# Patient Record
Sex: Female | Born: 1965 | Race: Black or African American | Hispanic: No | Marital: Married | Smoking: Current every day smoker
Health system: Southern US, Community
[De-identification: ages and names within clinical notes are randomized; demographics above are authoritative.]

## PROBLEM LIST (undated history)

## (undated) HISTORY — PX: BUNIONECTOMY WITH HAMMERTOE RECONSTRUCTION: SHX5600

## (undated) HISTORY — PX: TONSILLECTOMY: SUR1361

---

## 2004-10-27 ENCOUNTER — Emergency Department: Payer: Self-pay | Admitting: Emergency Medicine

## 2004-12-09 ENCOUNTER — Emergency Department: Payer: Self-pay | Admitting: Emergency Medicine

## 2005-11-07 ENCOUNTER — Emergency Department: Payer: Self-pay | Admitting: Emergency Medicine

## 2005-12-04 ENCOUNTER — Emergency Department: Payer: Self-pay | Admitting: Emergency Medicine

## 2006-02-18 ENCOUNTER — Other Ambulatory Visit: Payer: Self-pay

## 2006-02-18 ENCOUNTER — Emergency Department: Payer: Self-pay | Admitting: Emergency Medicine

## 2010-12-03 ENCOUNTER — Ambulatory Visit: Payer: Self-pay

## 2011-04-02 ENCOUNTER — Ambulatory Visit: Payer: Self-pay | Admitting: Internal Medicine

## 2011-04-06 ENCOUNTER — Ambulatory Visit: Payer: Self-pay | Admitting: Internal Medicine

## 2013-03-07 ENCOUNTER — Ambulatory Visit: Payer: Self-pay | Admitting: Family Medicine

## 2013-10-25 ENCOUNTER — Emergency Department: Payer: Self-pay

## 2013-10-25 LAB — BASIC METABOLIC PANEL
Anion Gap: 8 (ref 7–16)
BUN: 11 mg/dL (ref 7–18)
Calcium, Total: 8.9 mg/dL (ref 8.5–10.1)
Chloride: 106 mmol/L (ref 98–107)
Co2: 25 mmol/L (ref 21–32)
Creatinine: 0.68 mg/dL (ref 0.60–1.30)
EGFR (Non-African Amer.): >60
Osmolality: 277 (ref 275–301)
Potassium: 3.4 mmol/L — ABNORMAL LOW (ref 3.5–5.1)
Sodium: 139 mmol/L (ref 136–145)

## 2013-10-25 LAB — TROPONIN I: Troponin-I: <0.02 ng/mL

## 2013-10-25 LAB — CBC
HCT: 36.3 % (ref 35.0–47.0)
MCH: 28.3 pg (ref 26.0–34.0)
Platelet: 208 10*3/uL (ref 150–440)
RDW: 13.6 % (ref 11.5–14.5)

## 2014-03-08 ENCOUNTER — Ambulatory Visit: Payer: Self-pay | Admitting: Family Medicine

## 2014-08-21 ENCOUNTER — Ambulatory Visit (INDEPENDENT_AMBULATORY_CARE_PROVIDER_SITE_OTHER): Payer: BC Managed Care – PPO | Admitting: Podiatry

## 2014-08-21 ENCOUNTER — Ambulatory Visit (INDEPENDENT_AMBULATORY_CARE_PROVIDER_SITE_OTHER): Payer: BC Managed Care – PPO

## 2014-08-21 ENCOUNTER — Encounter: Payer: Self-pay | Admitting: Podiatry

## 2014-08-21 VITALS — BP 121/74 | HR 64 | Resp 16 | Ht 63.0 in | Wt 160.0 lb

## 2014-08-21 DIAGNOSIS — M21612 Bunion of left foot: Secondary | ICD-10-CM

## 2014-08-21 DIAGNOSIS — M21619 Bunion of unspecified foot: Secondary | ICD-10-CM

## 2014-08-21 DIAGNOSIS — M2040 Other hammer toe(s) (acquired), unspecified foot: Secondary | ICD-10-CM

## 2014-08-21 NOTE — Progress Notes (Signed)
Subjective:     Patient ID: Brandi Cruz, female   DOB: 01-29-1966, 48 y.o.   MRN: [id number]  Foot Pain   patient presents stating I'm having so much problems with my left foot and that when I try to wear shoes it hurts and I have tried to change my shoe gear soak my feet and trimming the callus on the bottom without relief. states it's been going on around 2 years   Review of Systems  All other systems reviewed and are negative.      Objective:   Physical Exam  Nursing note and vitals reviewed. Constitutional: She is oriented to person, place, and time.  Cardiovascular: Intact distal pulses.   Musculoskeletal: Normal range of motion.  Neurological: She is oriented to person, place, and time.  Skin: Skin is warm.   neurovascular status found to be intact with muscle strength adequate and range of motion of the subtalar and midtarsal joint within normal limits. Patient is noted to have well-perfused toes and is noted to have significant contracture of the lesser digits left over right with severe keratotic lesion sub-third metatarsal left over right. Large structural bunion on the left foot is noted that red and painful when pressed     Assessment:     Significant structural malalignment of the left foot with structural bunion digital deformity of the second third fourth and fifth toes and plantarflexed third metatarsal with keratotic lesion left over right    Plan:     H&P and x-rays reviewed with patient. I have recommended structural bunion correction along with digital fusion of digits 234 elevating osteotomy third and hammertoe repair fifth left. I did explain there is no guarantee that this will make the lesion go away but we'll give [place] her best chance to get better for the long-term. Today I did debris lesions to reduce pressure and reappoint in the next several weeks to review and is tentatively scheduled for surgery and October

## 2014-08-21 NOTE — Progress Notes (Signed)
   Subjective:    Patient ID: Brandi Cruz, female    DOB: 1966/10/27, 48 y.o.   MRN: [id number]  HPI Comments: My left foot has a bunion and my foot is turning over. Ive had it for over 2 years. Its getting worse. Shoes will hurt my foot. When i work over 8 hrs my foot will hurt while standing and walking. i buy otc stuff for my foot. They were all dr scholls products.  Foot Pain      Review of Systems  Genitourinary: Positive for frequency.  All other systems reviewed and are negative.      Objective:   Physical Exam        Assessment & Plan:

## 2014-09-04 ENCOUNTER — Ambulatory Visit (INDEPENDENT_AMBULATORY_CARE_PROVIDER_SITE_OTHER): Payer: BC Managed Care – PPO | Admitting: Podiatry

## 2014-09-04 VITALS — BP 127/78 | HR 58 | Resp 16

## 2014-09-04 DIAGNOSIS — M21612 Bunion of left foot: Secondary | ICD-10-CM

## 2014-09-04 DIAGNOSIS — M2012 Hallux valgus (acquired), left foot: Secondary | ICD-10-CM

## 2014-09-04 DIAGNOSIS — M2042 Other hammer toe(s) (acquired), left foot: Secondary | ICD-10-CM

## 2014-09-04 DIAGNOSIS — D649 Anemia, unspecified: Secondary | ICD-10-CM

## 2014-09-04 DIAGNOSIS — Q828 Other specified congenital malformations of skin: Secondary | ICD-10-CM

## 2014-09-04 NOTE — Progress Notes (Signed)
Patient ID: Brandi Cruz, female   DOB: 1966-09-18, 48 y.o.   MRN: [id number]  Subjective: Ms. Brandi Cruz returns the office today for complaints of left foot bunion and painful hammertoes. She states his been ongoing for several years but has been increasing painful the last year. She has tried shoe gear changes to help elevate pressure off of her feet without any resolution of symptoms. She is also tried offloading pads. At this time the patient is requesting surgical intervention to help decrease her pain and deformity. Discussed that as she gets periodic cramping in her legs. No acute changes since last appointment.  Objective: AAO x3, NAD DP/PT pulses palpable bilaterally, CRT less than 3 seconds Protective sensation intact with Simms Weinstein monofilament, vibratory sensation intact, Achilles reflex intact. Moderate structural HAV deformity, without any hypermobility. Tenderness to palpation over the prominent dorsal medial eminence of the first metatarsal head over the bunion deformity. Overlying skin intact. Semirigid hammertoe deformities of digits 2 through 5 with tenderness over the dorsal PIPJ. Diffuse hyperkeratotic lesion submetatarsal 2 and 3. MMT 5/5, ROM WNL.  No calf pain, swelling, warmth, erythema. No open lesions  Assessment: 48 year old female with left foot moderate HAV deformity, hammertoe deformities 2 through 5.  Plan: -X-rays were reviewed with the patient. -She follow up with me today due to her scheduled. I offered to have her see Dr. Paulla Dolly since he initially saw the patient. She is unable to digits her schedule. -Discussed with the patient likely surgical intervention would be offset-V bunionectomy with screw fixation, hammertoe repair digits 2 through 4 involving PIPJ arthrodesis with K wire fixation, fifth digit arthroplasty, second and third metatarsal osteotomies with screw fixation. Discussed with the patient the postoperative course in detail. Also discussed with her  alternatives, risks, complications of surgery. Risks of the surgery which were discussed with her in layman terms include, but not limited to, infection, bleeding, pain, swelling, need for further surgery, delayed or nonhealing, painful or ugly scar, numbness or sensation changes, blood clots, reoccurrence, transfer lesions, hardware failure, and blood clots. Patient understands the risks of this. She wishes to proceed with surgery. Surgical consent was reviewed the patient for which she signed. -Prior to surgery will obtain noninvasive vascular testing due to cramping in her legs. She did quit smoking approximately 2 weeks ago. Prior to this she was smoking one pack per week. -Also will obtain blood work as she states she has "low blood".  -Dispensed CAM boot for after surgery. -Will followup after surgery. In the meantime call the office with any questions, concerns, change in symptoms.

## 2014-09-10 ENCOUNTER — Telehealth: Payer: Self-pay | Admitting: *Deleted

## 2014-09-10 NOTE — Telephone Encounter (Signed)
Called and left message letting pt know appt at [facility] 10.23.15 11:30.

## 2014-10-12 ENCOUNTER — Telehealth: Payer: Self-pay | Admitting: *Deleted

## 2014-10-12 NOTE — Telephone Encounter (Signed)
Pt called wanting to know where she needs to be on the 23rd 24th or 25th? Called and left message for pt to return my call so we can figure out what she is talking about.

## 2014-10-16 ENCOUNTER — Ambulatory Visit: Payer: Self-pay | Admitting: Podiatry

## 2014-10-16 LAB — CBC WITH DIFFERENTIAL/PLATELET
Basophil #: 0 10*3/uL (ref 0.0–0.1)
Basophil %: 0.6 %
Eosinophil #: 0.2 10*3/uL (ref 0.0–0.7)
Eosinophil %: 2.8 %
HCT: 39.6 % (ref 35.0–47.0)
HGB: 12.6 g/dL (ref 12.0–16.0)
LYMPHS ABS: 1.6 10*3/uL (ref 1.0–3.6)
Lymphocyte %: 27.2 %
MCH: 27.7 pg (ref 26.0–34.0)
MCHC: 31.9 g/dL — AB (ref 32.0–36.0)
MCV: 87 fL (ref 80–100)
MONO ABS: 0.5 x10 3/mm (ref 0.2–0.9)
MONOS PCT: 9 %
NEUTROS ABS: 3.5 10*3/uL (ref 1.4–6.5)
Neutrophil %: 60.4 %
PLATELETS: 228 10*3/uL (ref 150–440)
RBC: 4.55 10*6/uL (ref 3.80–5.20)
RDW: 13.4 % (ref 11.5–14.5)
WBC: 5.8 10*3/uL (ref 3.6–11.0)

## 2014-10-16 LAB — BASIC METABOLIC PANEL
Anion Gap: 5 — ABNORMAL LOW (ref 7–16)
BUN: 12 mg/dL (ref 7–18)
CALCIUM: 8.5 mg/dL (ref 8.5–10.1)
Chloride: 110 mmol/L — ABNORMAL HIGH (ref 98–107)
Co2: 28 mmol/L (ref 21–32)
Creatinine: 0.74 mg/dL (ref 0.60–1.30)
EGFR (African American): >60
Glucose: 95 mg/dL (ref 65–99)
Osmolality: 285 (ref 275–301)
Potassium: 4.1 mmol/L (ref 3.5–5.1)
Sodium: 143 mmol/L (ref 136–145)

## 2014-10-22 ENCOUNTER — Encounter: Payer: Self-pay | Admitting: Podiatry

## 2014-10-22 DIAGNOSIS — M2012 Hallux valgus (acquired), left foot: Secondary | ICD-10-CM

## 2014-10-22 DIAGNOSIS — M21542 Acquired clubfoot, left foot: Secondary | ICD-10-CM

## 2014-10-22 DIAGNOSIS — M2042 Other hammer toe(s) (acquired), left foot: Secondary | ICD-10-CM

## 2014-10-23 ENCOUNTER — Encounter: Payer: Self-pay | Admitting: Podiatry

## 2014-10-23 NOTE — Progress Notes (Signed)
Reviewed

## 2014-10-26 NOTE — Progress Notes (Signed)
Austin bunionectomy left Metatarsal osteotomy left Hammer toe repair 4th left  Dos 11.30.15

## 2014-10-30 ENCOUNTER — Ambulatory Visit (INDEPENDENT_AMBULATORY_CARE_PROVIDER_SITE_OTHER): Payer: BC Managed Care – PPO | Admitting: Podiatry

## 2014-10-30 ENCOUNTER — Ambulatory Visit (INDEPENDENT_AMBULATORY_CARE_PROVIDER_SITE_OTHER): Payer: BC Managed Care – PPO

## 2014-10-30 VITALS — BP 117/71 | HR 69 | Resp 16

## 2014-10-30 DIAGNOSIS — Z9889 Other specified postprocedural states: Secondary | ICD-10-CM

## 2014-10-30 DIAGNOSIS — M2012 Hallux valgus (acquired), left foot: Secondary | ICD-10-CM

## 2014-10-30 DIAGNOSIS — M21612 Bunion of left foot: Secondary | ICD-10-CM

## 2014-10-30 MED ORDER — OXYCODONE-ACETAMINOPHEN 5-325 MG PO TABS: 1.0000 | ORAL_TABLET | Freq: Four times a day (QID) | ORAL | Status: AC | PRN

## 2014-10-30 NOTE — Progress Notes (Signed)
Patient ID: Brandi Cruz, female   DOB: [birth year], 48 y.o.   MRN: [id number]  Subjective: 48 year old female returns the office today 7 days status post left foot Austin bunionectomy with K-wire fixation, hammertoe repair digits 2 through 5 metatarsal osteotomies 2-4 (DOS: 10/23/14). She states that she has remained nonweightbearing as much as possible but she has been putting some weight to the foot. She has been wearing the cam walker. She states that she has no significant pain at this time and her pain is adequately controlled with pain medication. She denies any systemic complaints as fevers, chills, nausea, vomiting. She has finished her course of antibiotics. No other complaints at this time.  Objective: AAO x3, NAD DP/PT pulses palpable bilaterally, CRT less than 3 seconds Protective sensation intact with Simms Weinstein monofilament, vibratory sensation intact, Achilles tendon reflex intact. Incisions along the dorsal medial aspect the left foot and along digits 2 through 5 are well coapted without any evidence of dehiscence and sutures are intact. K-wires intact to digits 2 through 4. There is no surrounding erythema, drainage, increase in warmth to the area; no clinical signs of infection.There is edema to the midfoot. Motor function is intact to the digits and ankle. There is no significant pain on palpation to the area. No pain with calf compression, swelling, warmth, edema.   Assessment : 48 year old female 1 week status post Austin bunionectomy with hammertoe repair digits 2 through 5 metatarsal osteotomies 2 through 4.   Plan : -X-rays were obtained and reviewed with the patient. There is a previous slight contracture of the digit with wire fixation intact however clinically the toes appear to be in rectus alignment. Discussed the patient that if needed the K wire may need to come out early in the toe splinted. Patient stands. We'll continue to monitor the area.  -Betadine was painted  over all the incisions followed by dry sterile dressing. -Antibiotic ointment to the distal acid of the digits around the K wires. -Refilled Percocet.  -Continue wearing a CAM walker and to limit weightbearing. Discussed with patient and she should put the weight on the heel and not the forefoot. Continue with crutches. -Continue monitor signs or symptoms of DVT/PE and directed to the emergency room should any occur.  -Monitor for signs or symptoms of infection and directed to call the office if any are to occur or go to the emergency room.  -Follow-up next Thursday for likely suture removal. In the meantime, call the office if any questions, concerns, change in symptoms.

## 2014-11-08 ENCOUNTER — Ambulatory Visit (INDEPENDENT_AMBULATORY_CARE_PROVIDER_SITE_OTHER): Payer: BC Managed Care – PPO | Admitting: Podiatry

## 2014-11-08 VITALS — BP 131/88 | HR 109 | Resp 16

## 2014-11-08 DIAGNOSIS — Z9889 Other specified postprocedural states: Secondary | ICD-10-CM

## 2014-11-08 NOTE — Progress Notes (Signed)
Patient ID: Brandi Cruz, female   DOB: 05-31-1966, 48 y.o.   MRN: [id number]  Subjective: 48 year old female returns the office today 2 weeks  status post left foot Austin bunionectomy with K-wire fixation, hammertoe repair digits 2 through 5 metatarsal osteotomies 2-4 (DOS: 10/23/14). She states that she has been starting to put some weight on the foot in the CAM boot but continues to use crutches.  She states that she has no significant pain at this time and her pain is adequately controlled with pain medication. She denies any systemic complaints as fevers, chills, nausea, vomiting. She has finished her course of antibiotics. Denies any chest pain, SOB, calf pain. No other complaints at this time.  Objective: AAO x3, NAD DP/PT pulses palpable bilaterally, CRT less than 3 seconds Protective sensation intact with Simms Weinstein monofilament, vibratory sensation intact, Achilles tendon reflex intact. Incisions along the dorsal medial aspect the left foot and along digits 2 through 5 are well coapted without any evidence of dehiscence and sutures are intact. K-wires intact to digits 2 through 4. There is no surrounding erythema, drainage, increase in warmth to the area; no clinical signs of infection.There is edema to the foot over the surgical site, however decreased. Mild discomfort directly over the surgical site. 1st MTPJ ROM without pain. Motor function is intact to the digits and ankle. No pain with calf compression, swelling, warmth, edema.   Assessment : 48 year old female 2 weeks status post Austin bunionectomy with hammertoe repair digits 2 through 5 metatarsal osteotomies 2 through 4.   Plan : -Treatment options discussed including all alternatives, risks, complications.  -Sutures were removed without complications. Antibiotic ointment was applied over the incision followed by a dressing. Discussed with the patient to keep the dressing clean, dry, intact. Antibiotic ointment was also placed  around the distal aspect around the K wires. -Continue wearing a CAM walker and can weight-bear as tolerated but to do this gradually.  -Discussed range of motion activities for the first MTPJ to help prevent stiffness. -Continue monitor signs or symptoms of DVT/PE and directed to the emergency room should any occur.  -Monitor for signs or symptoms of infection and directed to call the office if any are to occur or go to the emergency room.  -Follow-up in 2 weeks or sooner if any problems are to arise. In the meantime, call the office if any questions, concerns, change in symptoms. X-rays next  appointment.

## 2014-11-27 ENCOUNTER — Ambulatory Visit (INDEPENDENT_AMBULATORY_CARE_PROVIDER_SITE_OTHER): Payer: BLUE CROSS/BLUE SHIELD

## 2014-11-27 ENCOUNTER — Ambulatory Visit (INDEPENDENT_AMBULATORY_CARE_PROVIDER_SITE_OTHER): Payer: BLUE CROSS/BLUE SHIELD | Admitting: Podiatry

## 2014-11-27 ENCOUNTER — Telehealth: Payer: Self-pay | Admitting: *Deleted

## 2014-11-27 VITALS — BP 137/83 | HR 69 | Resp 16

## 2014-11-27 DIAGNOSIS — M21612 Bunion of left foot: Secondary | ICD-10-CM

## 2014-11-27 DIAGNOSIS — M2012 Hallux valgus (acquired), left foot: Secondary | ICD-10-CM

## 2014-11-27 DIAGNOSIS — Z9889 Other specified postprocedural states: Secondary | ICD-10-CM

## 2014-11-27 DIAGNOSIS — M2042 Other hammer toe(s) (acquired), left foot: Secondary | ICD-10-CM

## 2014-11-27 NOTE — Telephone Encounter (Signed)
Pt states she will be late for her 1000am appt.

## 2014-11-27 NOTE — Progress Notes (Signed)
Patient ID: Brandi Cruz, female   DOB: Oct 15, 1966, 49 y.o.   MRN: [id number]  Subjective: 49 year old female returns the office for follow up evaluation status post left foot Austin bunionectomy, hammertoe repair digits 2-5 metatarsal osteotomies 2-4, date of surgery 10/23/2014. She states that she is continuing the CAM boot and she has been the majority of the weight onto her heel. She states that her pain is controlled and is not requiring narcotic pain medication. She has been continuing with gentle range of motion activities of the first MTPJ. She denies any systemic complaints such as fevers, chills, nausea, vomiting. Denies any calf pain, shortness of breath, chest pain. No other complaints at this time. No acute changes since last appointment.  Objective: AAO 3, NAD DP/PT pulse palpable, CRT less than 3 seconds Protective sensation intact with Simms Weinstein monofilament, Achilles tendon reflex intact. Incisions are well coapted without any evidence of dehiscence. There is no ascending erythema, ascending cellulitis, increased warmth, or any other clinical signs of infection. There is significant decrease in edema compared to prior appointment with skin wrinkles evident. K-wires are intact to the second, third, fourth digits. First MTPJ range of motion is intact and without discomfort. No pain with calf compression, swelling, warmth, erythema No other open lesions or pre-ulcerative lesions are identified.  Assessment: 49 year old female 5 weeks status post Austin bunionectomy, hammertoe repair digits 2-5, metatarsal osteotomies 2-4, doing well  Plan: -X-rays were obtained and reviewed with the patient. -Treatment options were discussed the patient including alternatives, risks, complications -At today's appointment the foot was redressed. Applied a small amount of interbody ointment overlying the incisions into the ends of the toes at the K wire sites. Recommended the patient to  continue doing this as well. Dispensed an ACE bandage to help control swelling. -Can weight-bear as tolerated in a surgical shoe. A surgical shoe was dispensed the patient today. Discussed with her that if she has any increase in pain while wearing this shoe to return to the boot. -Continue ice and elevation -Continue to monitor for any clinical signs or symptoms of infection and/or DVT/PE or any other complications. Stressed to call the office immediately should any occur or go directly to the emergency room. -Follow-up in 10 days for likely K-wire removal and repeat x-rays. In the meantime, encouraged to call the office with any questions, concerns, change in symptoms.

## 2014-12-06 ENCOUNTER — Ambulatory Visit (INDEPENDENT_AMBULATORY_CARE_PROVIDER_SITE_OTHER): Payer: BLUE CROSS/BLUE SHIELD

## 2014-12-06 ENCOUNTER — Ambulatory Visit (INDEPENDENT_AMBULATORY_CARE_PROVIDER_SITE_OTHER): Payer: BLUE CROSS/BLUE SHIELD | Admitting: Podiatry

## 2014-12-06 VITALS — BP 142/84 | HR 72 | Resp 16

## 2014-12-06 DIAGNOSIS — M21612 Bunion of left foot: Secondary | ICD-10-CM

## 2014-12-06 DIAGNOSIS — Z9889 Other specified postprocedural states: Secondary | ICD-10-CM

## 2014-12-06 DIAGNOSIS — M2012 Hallux valgus (acquired), left foot: Secondary | ICD-10-CM

## 2014-12-06 DIAGNOSIS — M2042 Other hammer toe(s) (acquired), left foot: Secondary | ICD-10-CM

## 2014-12-06 NOTE — Progress Notes (Signed)
Patient ID: Brandi Cruz, female   DOB: 12/09/1965, 49 y.o.   MRN: [id number]  Subjective: 49 year old female presents the office today for follow-up evaluation status post left foot Austin bunionectomy, hammertoe repair digits 2 through 5 the metatarsal osteotomies 2 through 4 performed on 10/23/2014. She states that she has been continuing with surgical shoe. She denies any significant pain and she has not required any pain medication. Denies any systemic complaints as fevers, chills, nausea, vomiting. No calf pain, short of breath, chest pain. No other complaints at this time in no acute changes since last appointment.  Objective: AAO 3, NAD DP/PT pulses palpable, CRT less than 3 seconds Protective sensation intact with Simms was monofilament, Achilles tendon reflex intact. Incision along the dorsal medial aspect of the left foot from the prior bunionectomy as well as over the digits are healed at this time. There is no surrounding erythema, increase in warmth, drainage, or any other clinical signs of infection at this time. K wires are intact to the second, third, fourth digit. There is edema overlying the forefoot. There is no increase in warmth or erythema to the foot. Mild tenderness directly overlying the surgical sites however no other areas of tenderness. No other open lesions or pre-ulcer lesions identified. No pain with calf compression, swelling, warmth, erythema.  Assessment: 49 year old female status post left Austin bunionectomy, hammertoe repair digits 2-5, metatarsal osteotomies 2-4; doing well  Plan: -X-rays were obtained and reviewed with the patient. -Treatment options were discussed including alternatives, risks, complications. -At this time as his been 6 weeks a wires were removed from the toes without complications in were removed in total. Antibiotic ointment was applied followed by a bandage. -Discussed the patient how to tape her toes to help control swelling and  to  help splint the toes. -Also at this time she has started transition back into a regular, loosefitting supportive sneaker as tolerated. If she has any significant increase in pain while transition to remain in the surgical shoe and call the office. -She can start to get the area wet in the next day or so however do not soak the foot. Once could now the shower apply anesthetic ointment overlying the incisions. If there is any problems the incisions or along the sites of the K wire removal to hold off on showering and call the office. -Dispensed anklet to help control swelling. -Follow-up in 3 weeks for repeat x-rays or sooner should any problems arise. In the meantime, encouraged to call the office with any questions, concerns, changes symptoms.

## 2014-12-27 ENCOUNTER — Ambulatory Visit (INDEPENDENT_AMBULATORY_CARE_PROVIDER_SITE_OTHER): Payer: BLUE CROSS/BLUE SHIELD | Admitting: Podiatry

## 2014-12-27 ENCOUNTER — Ambulatory Visit (INDEPENDENT_AMBULATORY_CARE_PROVIDER_SITE_OTHER): Payer: BLUE CROSS/BLUE SHIELD

## 2014-12-27 VITALS — BP 120/68 | HR 70

## 2014-12-27 DIAGNOSIS — M21612 Bunion of left foot: Secondary | ICD-10-CM

## 2014-12-27 DIAGNOSIS — R609 Edema, unspecified: Secondary | ICD-10-CM

## 2014-12-27 DIAGNOSIS — M2012 Hallux valgus (acquired), left foot: Secondary | ICD-10-CM

## 2014-12-27 NOTE — Patient Instructions (Signed)
Wear compression wrap to help the swelling Ice/elevation

## 2014-12-28 NOTE — Progress Notes (Signed)
Patient ID: Brandi Cruz, female   DOB: [birth year], 49 y.o.   MRN: [id number]  Subjective: 49 year old female returns the office today status post left Austin bunionectomy, hammertoe repair of digits 2-5, metatarsal ostomies 2-4. She states that overall she is doing well however she does have swelling to her foot which makes it difficult to wear shoes. She states that she has some sharp pain to the second toe otherwise her pain is controlled. She denies any redness over the area or any increase in warmth. She states that she has been on her feet more recently which may be contributing to the swelling. She has not been wearing the compression anklet and she has decreased ice and elevation. Denies any systemic complaints as fevers, chills, nausea, vomiting. No other complaints at this time in no acute changes since last appointment.  Objective: AAO 3, NAD DP/PT pulses palpable, CRT less than 3 seconds Protective sensation intact with Simms 1 monofilament, Achilles tendon reflex intact. Incisions on the surgical site are well healed at this time with eschar. There is no surrounding erythema or increase in warmth. There is moderate edema overlying the dorsal aspect of the forefoot without any overlying erythema or increase in warmth. There is no clinical signs of infection. There is no tenderness to palpation along the surgical sites. There no pain with first MTPJ range of motion however the motion is limited likely is result of the swelling. No other areas of tenderness to bilateral lower extremities. No pain with calf compression, swelling, warmth, erythema.  Assessment: 49 year old female status post left Austin bunionectomy, hammertoe repair of digits 2-5, metatarsal ostomies 2-4; presents with swelling without clinical signs of infection  Plan: -X-rays were obtained and reviewed with the patient. -Treatment options were discussed the patient including alternatives, risks, complications. -At  this time recommended her to continue with the compression anklet. She has not been wearing this. -Continue with ice and elevation as she has discontinued doing this. -She can transition into a regular shoe as tolerated once the swelling resolves. -Follow-up in 3 weeks or sooner if any problems are to arise. In the meantime, encouraged to call the office with any questions, concerns, change in symptoms.

## 2015-01-24 ENCOUNTER — Ambulatory Visit (INDEPENDENT_AMBULATORY_CARE_PROVIDER_SITE_OTHER): Payer: BLUE CROSS/BLUE SHIELD | Admitting: Podiatry

## 2015-01-24 ENCOUNTER — Encounter: Payer: Self-pay | Admitting: Podiatry

## 2015-01-24 ENCOUNTER — Ambulatory Visit (INDEPENDENT_AMBULATORY_CARE_PROVIDER_SITE_OTHER): Payer: BLUE CROSS/BLUE SHIELD

## 2015-01-24 VITALS — BP 137/82 | HR 66 | Resp 16

## 2015-01-24 DIAGNOSIS — M2012 Hallux valgus (acquired), left foot: Secondary | ICD-10-CM | POA: Diagnosis not present

## 2015-01-24 DIAGNOSIS — M21612 Bunion of left foot: Secondary | ICD-10-CM

## 2015-01-24 DIAGNOSIS — Z9889 Other specified postprocedural states: Secondary | ICD-10-CM

## 2015-01-24 NOTE — Progress Notes (Signed)
Patient ID: Brandi Cruz, female   DOB: 07-08-1966, 49 y.o.   MRN: [id number]  Subjective: 49 year old female returns the office today status post left Austin bunionectomy, hammertoe repair of digits 2-5, metatarsal ostomies 2-4 preformed on 10/23/14. She states that overall she is doing well and she has returned back into a regular shoe. She states that she is able to perform daily activities that much discomfort. She states that she gets some intermittent swelling to the foot for which she takes ibuprofen 4. She denies any redness or any increase in warmth to the foot. She has not required any pain medication. Denies any systemic complaints as fevers, chills, nausea, vomiting. No other complaints at this time in no acute changes since last appointment.  Objective: AAO 3, NAD DP/PT pulses palpable, CRT less than 3 seconds Protective sensation intact with Simms weinstein monofilament, Achilles tendon reflex intact. Incisions on the surgical site are well healed at this time. There is no surrounding erythema or increase in warmth. There is edema overlying the dorsal aspect of the forefoot without any overlying erythema or increase in warmth. Edema appears to be improved compared to prior appointment. There is no clinical signs of infection. There is no tenderness to palpation along the surgical sites. There no pain with first MTPJ range of motion . No pain with MTPJ range of motion.  No other areas of tenderness to bilateral lower extremities. No overlying erythema or increase in warmth. No pain with calf compression, swelling, warmth, erythema. Nails on the left foot are hypertrophic, dystrophic, discolored, elongated with tenderness palpation on the nails 1-5 bilaterally. There is no some erythema or drainage. Patient states that she did not want to trim the nails on her surgical foot.  Assessment: 49 year old female status post left Austin bunionectomy, hammertoe repair of digits 2-5, metatarsal  osteotomies 2-4  Plan: -X-rays were obtained and reviewed with the patient. -Treatment options were discussed including alternatives, risks, complications. -At this time continue with regular shoe gear as tolerated. Continue the ankle compression sock to help control swelling. Ice and elevation. -Discussed patient she can return to work on March 14 as tolerated. Discussed with her to try to keep ice and elevation as much as possible. -Discussed orthotics to help support her foot type. A prescription for orthotics was given to the patient for [facility]. -Nail sharply debrided 5 on the left foot without complication/bleeding. -Follow-up in 6 weeks or sooner if any problems are to arise. In the meantime, encouraged to call the office with any questions, concerns, change in symptoms.

## 2015-03-07 ENCOUNTER — Ambulatory Visit: Payer: BLUE CROSS/BLUE SHIELD | Admitting: Podiatry

## 2015-03-12 ENCOUNTER — Ambulatory Visit (INDEPENDENT_AMBULATORY_CARE_PROVIDER_SITE_OTHER): Payer: BLUE CROSS/BLUE SHIELD

## 2015-03-12 ENCOUNTER — Ambulatory Visit (INDEPENDENT_AMBULATORY_CARE_PROVIDER_SITE_OTHER): Payer: BLUE CROSS/BLUE SHIELD | Admitting: Podiatry

## 2015-03-12 VITALS — BP 118/68 | HR 80 | Resp 16

## 2015-03-12 DIAGNOSIS — M2012 Hallux valgus (acquired), left foot: Secondary | ICD-10-CM

## 2015-03-12 DIAGNOSIS — L84 Corns and callosities: Secondary | ICD-10-CM | POA: Diagnosis not present

## 2015-03-12 DIAGNOSIS — B351 Tinea unguium: Secondary | ICD-10-CM

## 2015-03-12 DIAGNOSIS — Z9889 Other specified postprocedural states: Secondary | ICD-10-CM | POA: Diagnosis not present

## 2015-03-13 ENCOUNTER — Ambulatory Visit
Admit: 2015-03-13 | Disposition: A | Discharge status: Home / Self Care | Payer: Self-pay | Attending: Family Medicine | Admitting: Family Medicine

## 2015-03-13 NOTE — Progress Notes (Signed)
Patient ID: Brandi Cruz, female   DOB: 06-24-1966, 49 y.o.   MRN: [id number]  Subjective: 49 year old female presents the office today status post left foot Austin bunionectomy, hammertoe repair of digits 2 through 5, metatarsal osteotomies 2 through 4 performed on 10/23/2014. She states that since last appointment she has been continuing to walk in regular shoe gear and wearing orthotics for which she recently had made. She states that overall she is doing well from the surgical sites. She does that she has calluses in the bottoms of both of her feet which are painful particularly with pressure and weightbearing. She has a states her nails are elongated and painful for which she is unable to trim herself. Denies any redness or drainage on the nail callus sites. Denies any systemic complaints such as fevers, chills, nausea, vomiting. No acute changes since last appointment, and no other complaints at this time.   Objective: AAO x3, NAD DP/PT pulses palpable bilaterally, CRT less than 3 seconds Protective sensation intact with Simms Weinstein monofilament, vibratory sensation intact, Achilles tendon reflex intact Incisions along the dorsal aspect left foot are all well coapted and well formed scars present. There is no surrounding erythema, increase in warmth, or other clinical signs of infection. There is mild edema to the left foot however it is significantly improved compared to prior appointment and is almost about the same as the contralateral extremity. There is no areas of tenderness overlying the surgical sites. There is no pain with first MTPJ range of motion or went to the lesser digit MTPJ's. There is hyperkeratotic lesions bilateral submetatarsal 3. There is also hyperkeratotic lesion on the dorsal lateral aspect of the right fifth toe with underlying adductovarus deformity. Upon debridement of the lesions the underlying skin is intact and there is no swelling erythema, drainage or other clinical  signs of infection. Nails are dystrophic, discolored, elongated, brittle 10. There is just tenderness on nails 1-5 bilaterally. There is no swelling erythema or drainage on the nail sites. No other areas of pinpoint bony tenderness or pain with vibratory sensation. MMT 5/5, ROM WNL. No edema, erythema, increase in warmth to bilateral lower extremities.  No open lesions or pre-ulcerative lesions.  No pain with calf compression, swelling, warmth, erythema  Assessment: 49 year old female status post left foot surgery doing well with submetatarsal 3 hyperkeratotic lesions and symptomatic onychomycosis  Plan: -All treatment options discussed with the patient including all alternatives, risks, complications.  -X-rays were obtained and reviewed the patient -Hyperkeratotic lesion sharply debrided 3 without complication/bleeding pad was placed around lesions to bilateral submetatarsal 3 followed by salicylic acid and occlusive bandage. Post procedure instructions were discussed the patient for which she understood. Monitor for any signs or symptoms of infection. -Nails sharply debrided 10 without complication/bleeding -Metatarsal pads were added to the orthotics -Continue regular shoe gear -Follow-up in 6 weeks -Patient encouraged to call the office with any questions, concerns, change in symptoms.

## 2015-04-03 DIAGNOSIS — F17200 Nicotine dependence, unspecified, uncomplicated: Secondary | ICD-10-CM | POA: Insufficient documentation

## 2015-04-18 ENCOUNTER — Ambulatory Visit (INDEPENDENT_AMBULATORY_CARE_PROVIDER_SITE_OTHER): Payer: BLUE CROSS/BLUE SHIELD | Admitting: Podiatry

## 2015-04-18 DIAGNOSIS — Z9889 Other specified postprocedural states: Secondary | ICD-10-CM | POA: Diagnosis not present

## 2015-04-18 DIAGNOSIS — M2012 Hallux valgus (acquired), left foot: Secondary | ICD-10-CM

## 2015-04-22 NOTE — Progress Notes (Signed)
Patient ID: Brandi Cruz, female   DOB: [birth year], 49 y.o.   MRN: [id number]  Subjective: Brandi Cruz  presents the office today status post left foot Austin bunionectomy, hammertoe repair of digits 2 through 5, metatarsal osteotomies 2 through 4 performed on 10/23/2014. She states that since last appointment she is doing well and she has been continuing to work in a regular shoe without difficulty. She doesn't the area swells intermittently although she does not have any pain to her foot. She denies any redness. Denies any redness or drainage on the nail callus sites. Denies any systemic complaints such as fevers, chills, nausea, vomiting. No acute changes since last appointment, and no other complaints at this time.   Objective: AAO x3, NAD DP/PT pulses palpable bilaterally, CRT less than 3 seconds Protective sensation intact with Simms Weinstein monofilament, vibratory sensation intact, Achilles tendon reflex intact Incisions along the dorsal aspect left foot are all well coapted and well formed scars present. There is no surrounding erythema, increase in warmth, or other clinical signs of infection. There is mild edema to the left foot. There are no areas of tenderness overlying the surgical sites. There is no pain with first MTPJ range of motion although it is somewhat limited. There is no pain with ROM of the lesser digit MTPJ's. No other areas of pinpoint bony tenderness or pain with vibratory sensation. No edema, erythema, increase in warmth to bilateral lower extremities.  No open lesions or pre-ulcerative lesions.  No pain with calf compression, swelling, warmth, erythema  Assessment: 49 year old female status post left foot surgery doing well   Plan: -All treatment options discussed with the patient including all alternatives, risks, complications.  -X-rays were obtained and reviewed the patient -Encouraged patient to use the compression anklet to help control swelling if she is on her  feet all day with work. Also continue with a regular supportive shoe gear. She can increase her activity as tolerated. -Follow-up with me as needed. -Patient encouraged to call the office with any questions, concerns, change in symptoms.

## 2015-06-10 ENCOUNTER — Encounter: Payer: Self-pay | Admitting: *Deleted

## 2015-06-10 ENCOUNTER — Ambulatory Visit: Payer: BLUE CROSS/BLUE SHIELD | Admitting: Anesthesiology

## 2015-06-10 ENCOUNTER — Encounter
Admission: RE | Disposition: A | Discharge status: Home / Self Care | Payer: Self-pay | Source: Ambulatory Visit | Attending: Gastroenterology

## 2015-06-10 ENCOUNTER — Ambulatory Visit
Admission: RE | Admit: 2015-06-10 | Discharge: 2015-06-10 | Disposition: A | Discharge status: Home / Self Care | Payer: BLUE CROSS/BLUE SHIELD | Source: Ambulatory Visit | Attending: Gastroenterology | Admitting: Gastroenterology

## 2015-06-10 DIAGNOSIS — F17210 Nicotine dependence, cigarettes, uncomplicated: Secondary | ICD-10-CM | POA: Insufficient documentation

## 2015-06-10 DIAGNOSIS — D124 Benign neoplasm of descending colon: Secondary | ICD-10-CM | POA: Diagnosis not present

## 2015-06-10 DIAGNOSIS — K5730 Diverticulosis of large intestine without perforation or abscess without bleeding: Secondary | ICD-10-CM | POA: Diagnosis not present

## 2015-06-10 DIAGNOSIS — Z79899 Other long term (current) drug therapy: Secondary | ICD-10-CM | POA: Insufficient documentation

## 2015-06-10 DIAGNOSIS — D120 Benign neoplasm of cecum: Secondary | ICD-10-CM | POA: Insufficient documentation

## 2015-06-10 DIAGNOSIS — Z1211 Encounter for screening for malignant neoplasm of colon: Secondary | ICD-10-CM | POA: Insufficient documentation

## 2015-06-10 HISTORY — PX: COLONOSCOPY WITH PROPOFOL: SHX5780

## 2015-06-10 SURGERY — COLONOSCOPY WITH PROPOFOL
Anesthesia: General

## 2015-06-10 MED ORDER — SODIUM CHLORIDE 0.9 % IV SOLN
INTRAVENOUS | Status: DC
  Administered 2015-06-10: 14:00:00 via INTRAVENOUS

## 2015-06-10 MED ORDER — MIDAZOLAM HCL 2 MG/2ML IJ SOLN
INTRAMUSCULAR | Status: DC | PRN
  Administered 2015-06-10: 1 mg via INTRAVENOUS

## 2015-06-10 MED ORDER — SODIUM CHLORIDE 0.9 % IV SOLN: INTRAVENOUS | Status: DC

## 2015-06-10 MED ORDER — LIDOCAINE HCL (CARDIAC) 20 MG/ML IV SOLN
INTRAVENOUS | Status: DC | PRN
  Administered 2015-06-10: 60 mg via INTRAVENOUS

## 2015-06-10 MED ORDER — FENTANYL CITRATE (PF) 100 MCG/2ML IJ SOLN
INTRAMUSCULAR | Status: DC | PRN
  Administered 2015-06-10: 50 ug via INTRAVENOUS

## 2015-06-10 MED ORDER — EPHEDRINE SULFATE 50 MG/ML IJ SOLN
INTRAMUSCULAR | Status: DC | PRN
  Administered 2015-06-10 (×2): 5 mg via INTRAVENOUS

## 2015-06-10 NOTE — Op Note (Signed)
[facility] Patient Name: Brandi Cruz Procedure Date: 06/10/2015 2:24 PM MRN: [id number] Account #: [id number] Date of Birth: 1966/02/14 Admit Type: Outpatient Age: 49 Room: [facility] ENDO ROOM 2 Gender: Female Note Status: Finalized Procedure:         Colonoscopy Indications:       Screening for colorectal malignant neoplasm Providers:         Lollie Sails, MD Referring MD:      Glendon Axe (Referring MD) Medicines:         Monitored Anesthesia Care Complications:     No immediate complications. Procedure:         Pre-Anesthesia Assessment:                    - ASA Grade Assessment: II - A patient with mild systemic                     disease.                    After obtaining informed consent, the colonoscope was                     passed under direct vision. Throughout the procedure, the                     patient's blood pressure, pulse, and oxygen saturations                     were monitored continuously. The Colonoscope was                     introduced through the anus and advanced to the the cecum,                     identified by appendiceal orifice and ileocecal valve. The                     colonoscopy was performed without difficulty. The patient                     tolerated the procedure well. The quality of the bowel                     preparation was good. Findings:      Multiple small-mouthed diverticula were found in the sigmoid colon, in       the descending colon, in the transverse colon and in the ascending colon.      A 3 mm polyp was found in the descending colon. The polyp was sessile.       The polyp was removed with a cold snare. Resection and retrieval were       complete.      A 2 mm polyp was found at the appendiceal orifice. The polyp was       sessile. The polyp was removed with a cold biopsy forceps. Resection and       retrieval were complete.      The digital rectal exam was normal. Impression:         - Diverticulosis in the sigmoid colon, in the descending                     colon, in the transverse colon and in the ascending colon.                    -  One 3 mm polyp in the descending colon. Resected and                     retrieved.                    - 2 mm polyp at the peri-appendiceal orifice. Resected and                     retrieved. Recommendation:    - Await pathology results.                    - Telephone GI clinic for pathology results in 1 week. Procedure Code(s): --- Professional ---                    [phone], Colonoscopy, flexible; with removal of tumor(s),                     polyp(s), or other lesion(s) by snare technique                    45380, 12, Colonoscopy, flexible; with biopsy, single or                     multiple Diagnosis Code(s): --- Professional ---                    V76.51, Special screening for malignant neoplasms of colon                    211.3, Benign neoplasm of colon                    562.10, Diverticulosis of colon (without mention of                     hemorrhage) CPT copyright 2014 American Medical Association. All rights reserved. The codes documented in this report are preliminary and upon coder review may  be revised to meet current compliance requirements. Lollie Sails, MD 06/10/2015 3:02:29 PM This report has been signed electronically. Number of Addenda: 0 Note Initiated On: 06/10/2015 2:24 PM Scope Withdrawal Time: 0 hours 5 minutes 54 seconds  Total Procedure Duration: 0 hours 17 minutes 52 seconds       [facility]

## 2015-06-10 NOTE — Anesthesia Preprocedure Evaluation (Signed)
Anesthesia Evaluation  Patient identified by MRN, date of birth, ID band Patient awake    Reviewed: Allergy & Precautions, H&P , NPO status , Patient's Chart, lab work & pertinent test results, reviewed documented beta blocker date and time   Airway Mallampati: II  TM Distance: >3 FB Neck ROM: full    Dental no notable dental hx. (+) Teeth Intact   Pulmonary Current Smoker,  breath sounds clear to auscultation  Pulmonary exam normal       Cardiovascular negative cardio ROS Normal cardiovascular examRhythm:regular Rate:Normal     Neuro/Psych negative neurological ROS  negative psych ROS   GI/Hepatic negative GI ROS, Neg liver ROS,   Endo/Other  negative endocrine ROS  Renal/GU negative Renal ROS  negative genitourinary   Musculoskeletal negative musculoskeletal ROS (+)   Abdominal   Peds negative pediatric ROS (+)  Hematology negative hematology ROS (+)   Anesthesia Other Findings History reviewed. No pertinent past medical history.   Reproductive/Obstetrics negative OB ROS                             Anesthesia Physical Anesthesia Plan  ASA: II  Anesthesia Plan: General   Post-op Pain Management:    Induction:   Airway Management Planned:   Additional Equipment:   Intra-op Plan:   Post-operative Plan:   Informed Consent: I have reviewed the patients History and Physical, chart, labs and discussed the procedure including the risks, benefits and alternatives for the proposed anesthesia with the patient or authorized representative who has indicated his/her understanding and acceptance.   Dental Advisory Given  Plan Discussed with: Anesthesiologist, CRNA and Surgeon  Anesthesia Plan Comments:         Anesthesia Quick Evaluation

## 2015-06-10 NOTE — H&P (Signed)
Outpatient short stay form Pre-procedure 06/10/2015 2:18 PM Brandi Sails MD  Primary Physician: Dr. Glendon Axe  Reason for visit:  Colonoscopy  History of present illness:  Patient is a 49 year old female sending for a screening colonoscopy. She tolerated her prep well. She does not take anticoagulates or recent aspirin products.    Current facility-administered medications:  .  0.9 %  sodium chloride infusion, , Intravenous, Continuous, Brandi Sails, MD, Last Rate: 10 mL/hr at 06/10/15 1409 .  0.9 %  sodium chloride infusion, , Intravenous, Continuous, Brandi Sails, MD  Prescriptions prior to admission  Medication Sig Dispense Refill Last Dose  . ibuprofen (ADVIL,MOTRIN) 200 MG tablet Take 200 mg by mouth daily.   Past Month at Unknown time  . oxyCODONE-acetaminophen (PERCOCET/ROXICET) 5-325 MG per tablet Take 1 tablet by mouth every 6 (six) hours as needed for severe pain. (Patient not taking: Reported on 04/18/2015) 30 tablet 0 Completed Course at Unknown time  . promethazine (PHENERGAN) 12.5 MG tablet Take 12.5 mg by mouth every 6 (six) hours as needed for nausea or vomiting.   Completed Course at Unknown time     No Known Allergies   History reviewed. No pertinent past medical history.  Review of systems:      Physical Exam    Heart and lungs: Regular rate and rhythm without rub or gallop, lungs are bilaterally clear    HEENT: Normocephalic atraumatic eyes are anicteric    Other:     Pertinant exam for procedure: Soft nontender nondistended bowel sounds positive normoactive    Planned proceedures: Colonoscopy with indicated procedures I have discussed the risks benefits and complications of procedures to include not limited to bleeding, infection, perforation and the risk of sedation and the patient wishes to proceed.    Brandi Sails, MD Gastroenterology 06/10/2015  2:18 PM

## 2015-06-10 NOTE — Transfer of Care (Signed)
Immediate Anesthesia Transfer of Care Note  Patient: Brandi Cruz  Procedure(s) Performed: Procedure(s): COLONOSCOPY WITH PROPOFOL (N/A)  Patient Location: PACU  Anesthesia Type:General  Level of Consciousness: awake, alert  and oriented  Airway & Oxygen Therapy: Patient Spontanous Breathing and Patient connected to nasal cannula oxygen  Post-op Assessment: Report given to RN and Post -op Vital signs reviewed and stable  Post vital signs: Reviewed and stable  Last Vitals:  Filed Vitals:   06/10/15 1351  BP: 125/84  Pulse: 54  Temp: 36.1 C  Resp: 14    Complications: No apparent anesthesia complications

## 2015-06-11 NOTE — Anesthesia Postprocedure Evaluation (Signed)
  Anesthesia Post-op Note  Patient: Brandi Cruz  Procedure(s) Performed: Procedure(s): COLONOSCOPY WITH PROPOFOL (N/A)  Anesthesia type:General  Patient location: PACU  Post pain: Pain level controlled  Post assessment: Post-op Vital signs reviewed, Patient's Cardiovascular Status Stable, Respiratory Function Stable, Patent Airway and No signs of Nausea or vomiting  Post vital signs: Reviewed and stable  Last Vitals:  Filed Vitals:   06/10/15 1535  BP: 130/89  Pulse: 49  Temp:   Resp: 19    Level of consciousness: awake, alert  and patient cooperative  Complications: No apparent anesthesia complications

## 2015-06-12 LAB — SURGICAL PATHOLOGY

## 2015-06-17 ENCOUNTER — Encounter: Payer: Self-pay | Admitting: Gastroenterology

## 2015-12-25 ENCOUNTER — Emergency Department
Admission: EM | Admit: 2015-12-25 | Discharge: 2015-12-25 | Disposition: A | Discharge status: Home / Self Care | Payer: BLUE CROSS/BLUE SHIELD | Attending: Emergency Medicine | Admitting: Emergency Medicine

## 2015-12-25 ENCOUNTER — Encounter: Payer: Self-pay | Admitting: Emergency Medicine

## 2015-12-25 ENCOUNTER — Emergency Department: Payer: BLUE CROSS/BLUE SHIELD

## 2015-12-25 DIAGNOSIS — M47812 Spondylosis without myelopathy or radiculopathy, cervical region: Secondary | ICD-10-CM | POA: Diagnosis not present

## 2015-12-25 DIAGNOSIS — Z791 Long term (current) use of non-steroidal anti-inflammatories (NSAID): Secondary | ICD-10-CM | POA: Insufficient documentation

## 2015-12-25 DIAGNOSIS — M542 Cervicalgia: Secondary | ICD-10-CM | POA: Diagnosis present

## 2015-12-25 DIAGNOSIS — F17200 Nicotine dependence, unspecified, uncomplicated: Secondary | ICD-10-CM | POA: Insufficient documentation

## 2015-12-25 MED ORDER — KETOROLAC TROMETHAMINE 60 MG/2ML IM SOLN
60.0000 mg | Freq: Once | INTRAMUSCULAR | Status: AC
  Administered 2015-12-25: 60 mg via INTRAMUSCULAR
  Filled 2015-12-25: qty 2

## 2015-12-25 MED ORDER — MELOXICAM 15 MG PO TABS: 15.0000 mg | ORAL_TABLET | Freq: Every day | ORAL | Status: AC

## 2015-12-25 NOTE — ED Notes (Signed)
See triage noted.states she does a lot of movement and standing on concrete floors for work.

## 2015-12-25 NOTE — ED Provider Notes (Signed)
[facility] Emergency Department Provider Note  ____________________________________________  Time seen: Approximately 10:06 AM  I have reviewed the triage vital signs and the nursing notes.   HISTORY  Chief Complaint Joint Pain    HPI Brandi Cruz is a 50 y.o. female patient complaining of 3 months of neck and hip pain which is worse in the morning and improves during the day. Patient states source or cause a lot of prolonged standing on concrete floors. Patient denies any radicular component to her neck pain. Patient states unresolved with over-the-counter NSAIDs and massages which had given her ice of employment. Patient denies any bladder or bowel dysfunction. She rates the pain 7/10.   History reviewed. No pertinent past medical history.  There are no active problems to display for this patient.   Past Surgical History  Procedure Laterality Date  . Bunionectomy with hammertoe reconstruction    . Tonsillectomy    . Colonoscopy with propofol N/A 06/10/2015    Procedure: COLONOSCOPY WITH PROPOFOL;  Surgeon: Lollie Sails, MD;  Location: [facility] ENDOSCOPY;  Service: Endoscopy;  Laterality: N/A;    Current Outpatient Rx  Name  Route  Sig  Dispense  Refill  . ibuprofen (ADVIL,MOTRIN) 200 MG tablet   Oral   Take 200 mg by mouth daily.         . meloxicam (MOBIC) 15 MG tablet   Oral   Take 1 tablet (15 mg total) by mouth daily.   30 tablet   2   . oxyCODONE-acetaminophen (PERCOCET/ROXICET) 5-325 MG per tablet   Oral   Take 1 tablet by mouth every 6 (six) hours as needed for severe pain. Patient not taking: Reported on 04/18/2015   30 tablet   0   . promethazine (PHENERGAN) 12.5 MG tablet   Oral   Take 12.5 mg by mouth every 6 (six) hours as needed for nausea or vomiting.           Allergies Review of patient's allergies indicates no known allergies.  No family history on file.  Social History Social History  Substance Use Topics   . Smoking status: Current Every Day Smoker -- 0.50 packs/day for 10 years  . Smokeless tobacco: None  . Alcohol Use: 0.6 oz/week    1 Cans of beer per week    Review of Systems Constitutional: No fever/chills Eyes: No visual changes. ENT: No sore throat. Cardiovascular: Denies chest pain. Respiratory: Denies shortness of breath. Gastrointestinal: No abdominal pain.  No nausea, no vomiting.  No diarrhea.  No constipation. Genitourinary: Negative for dysuria. Musculoskeletal: Posterior neck and bilateral hip pain. Skin: Negative for rash. Neurological: Negative for headaches, focal weakness or numbness.    ____________________________________________   PHYSICAL EXAM:  VITAL SIGNS: ED Triage Vitals  Enc Vitals Group     BP 12/25/15 0936 134/65 mmHg     Pulse Rate 12/25/15 0936 64     Resp 12/25/15 0936 18     Temp 12/25/15 0936 97.8 F (36.6 C)     Temp src --      SpO2 12/25/15 0936 99 %     Weight 12/25/15 0936 165 lb (74.844 kg)     Height 12/25/15 0936 5\' 4"  (1.626 m)     Head Cir --      Peak Flow --      Pain Score 12/25/15 0957 7     Pain Loc --      Pain Edu? --  Excl. in [place]? --     Constitutional: Alert and oriented. Well appearing and in no acute distress. Eyes: Conjunctivae are normal. PERRL. EOMI. Head: Atraumatic. Nose: No congestion/rhinnorhea. Mouth/Throat: Mucous membranes are moist.  Oropharynx non-erythematous. Neck: No stridor.  No cervical spine tenderness to palpation. Hematological/Lymphatic/Immunilogical: No cervical lymphadenopathy. Cardiovascular: Normal rate, regular rhythm. Grossly normal heart sounds.  Good peripheral circulation. Respiratory: Normal respiratory effort.  No retractions. Lungs CTAB. Gastrointestinal: Soft and nontender. No distention. No abdominal bruits. No CVA tenderness. Musculoskeletal: No obvious deformity to the neck or hips. Full and equal range of motion of the neck and hips. Neurologic:  Normal speech and  language. No gross focal neurologic deficits are appreciated. No gait instability. Skin:  Skin is warm, dry and intact. No rash noted. Psychiatric: Mood and affect are normal. Speech and behavior are normal.  ____________________________________________   LABS (all labs ordered are listed, but only abnormal results are displayed)  Labs Reviewed - No data to display ____________________________________________  EKG   ____________________________________________  RADIOLOGY __Cervical x-rays show degenerative changes . Elbow x-ray shows foreign body left thigh consistent with previous injury. No other acute findings. __________________________________________   PROCEDURES  Procedure(s) performed: None  Critical Care performed: No  ____________________________________________   INITIAL IMPRESSION / ASSESSMENT AND PLAN / ED COURSE  Pertinent labs & imaging results that were available during my care of the patient were reviewed by me and considered in my medical decision making (see chart for details).  Cervical pain secondary also arthritis. Discussed x-ray findings with patient. She given prescription for meloxicam and advised to follow-up with family doctor for continued care. Patient given a work excuse for today. ____________________________________________   FINAL CLINICAL IMPRESSION(S) / ED DIAGNOSES  Final diagnoses:  Osteoarthritis cervical spine      Sable Feil, PA-C 12/25/15 [place], MD 12/25/15 [phone]

## 2015-12-25 NOTE — ED Notes (Signed)
Developed pain to neck shoulders and hip areas since nov. No specific injury

## 2016-01-07 DIAGNOSIS — M47812 Spondylosis without myelopathy or radiculopathy, cervical region: Secondary | ICD-10-CM | POA: Insufficient documentation

## 2017-01-22 IMAGING — MG MM DIGITAL SCREENING BILAT W/ CAD
5 series · 5 of 5 positions shown · non-contrast
Comparison: Previous exam(s).

CLINICAL DATA: Screening.

EXAM:
DIGITAL SCREENING BILATERAL MAMMOGRAM WITH CAD

[L MLO (1 of 2)]
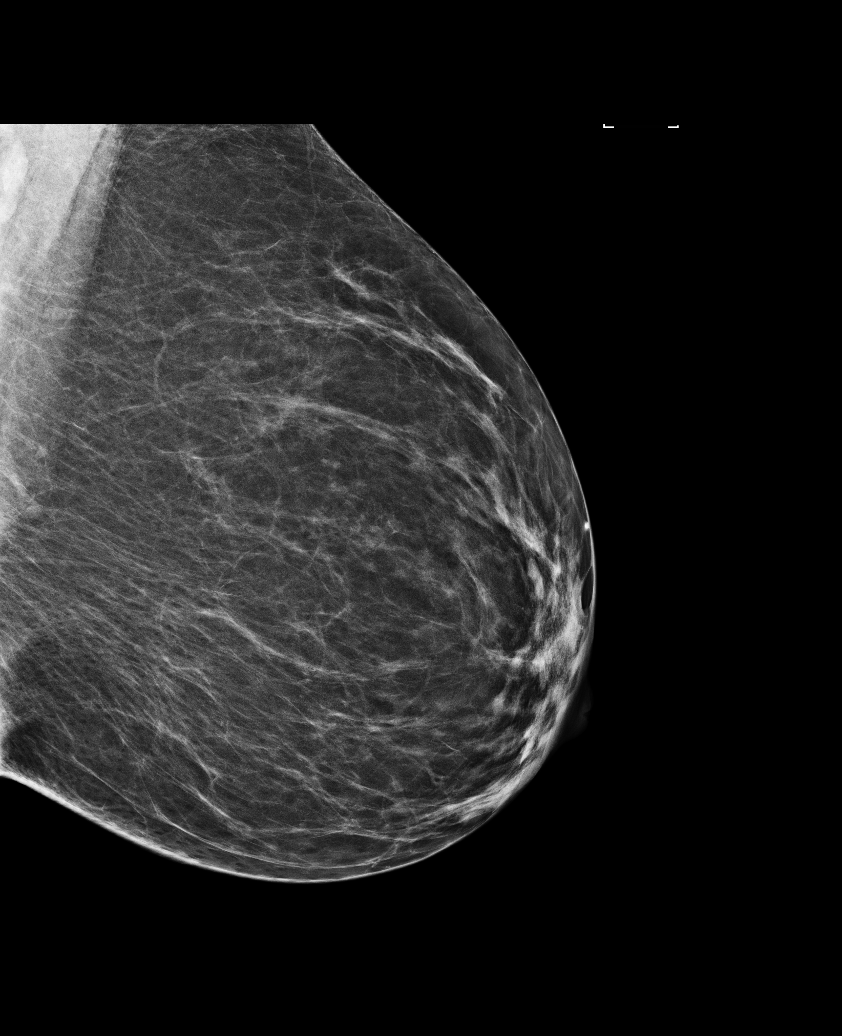

[L CC]
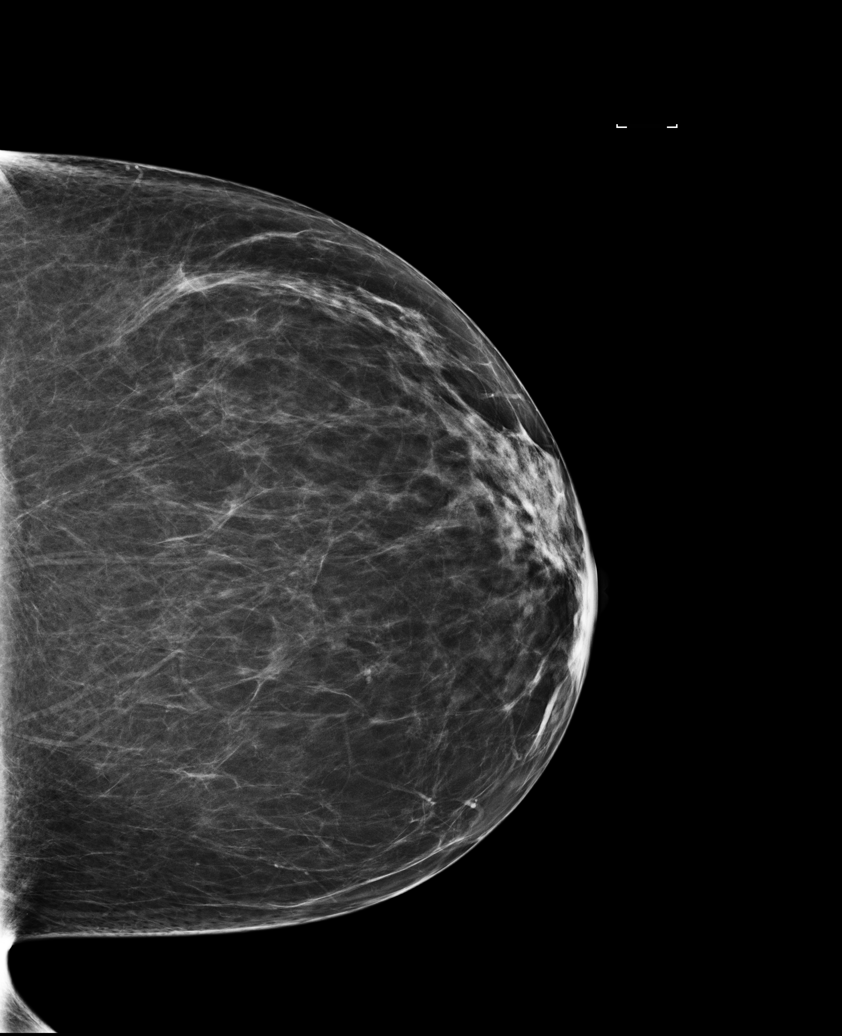

[R CC]
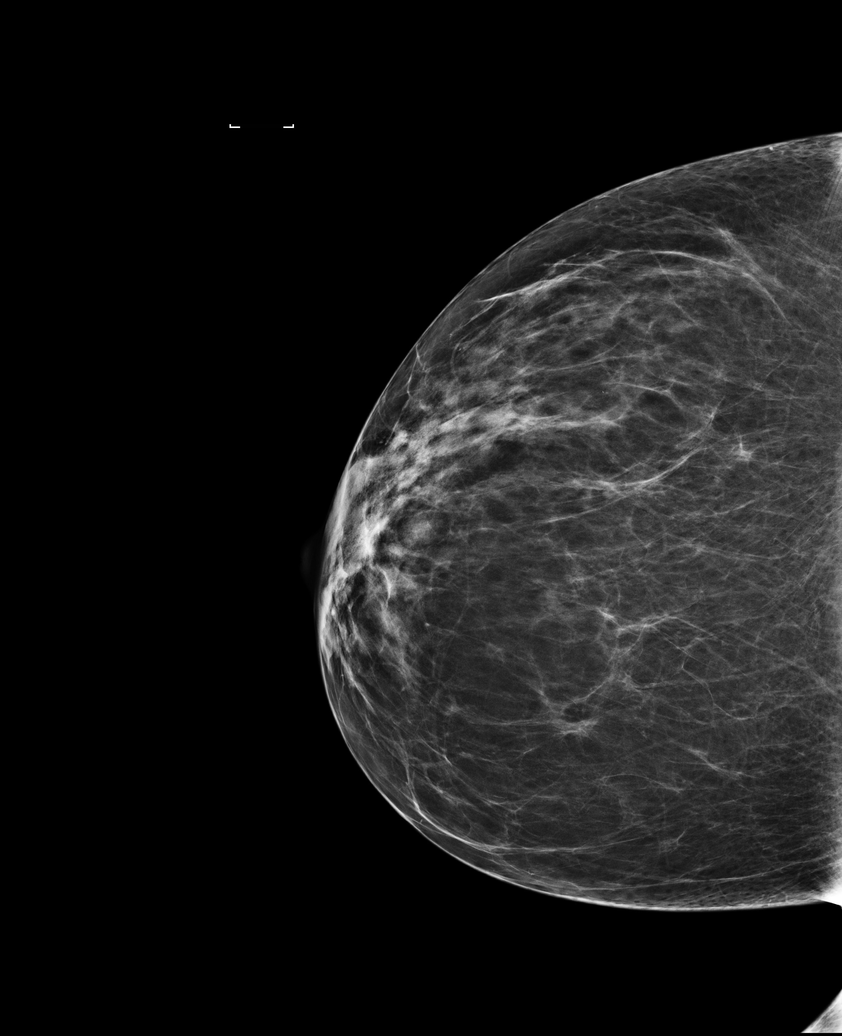

[L MLO (2 of 2)]
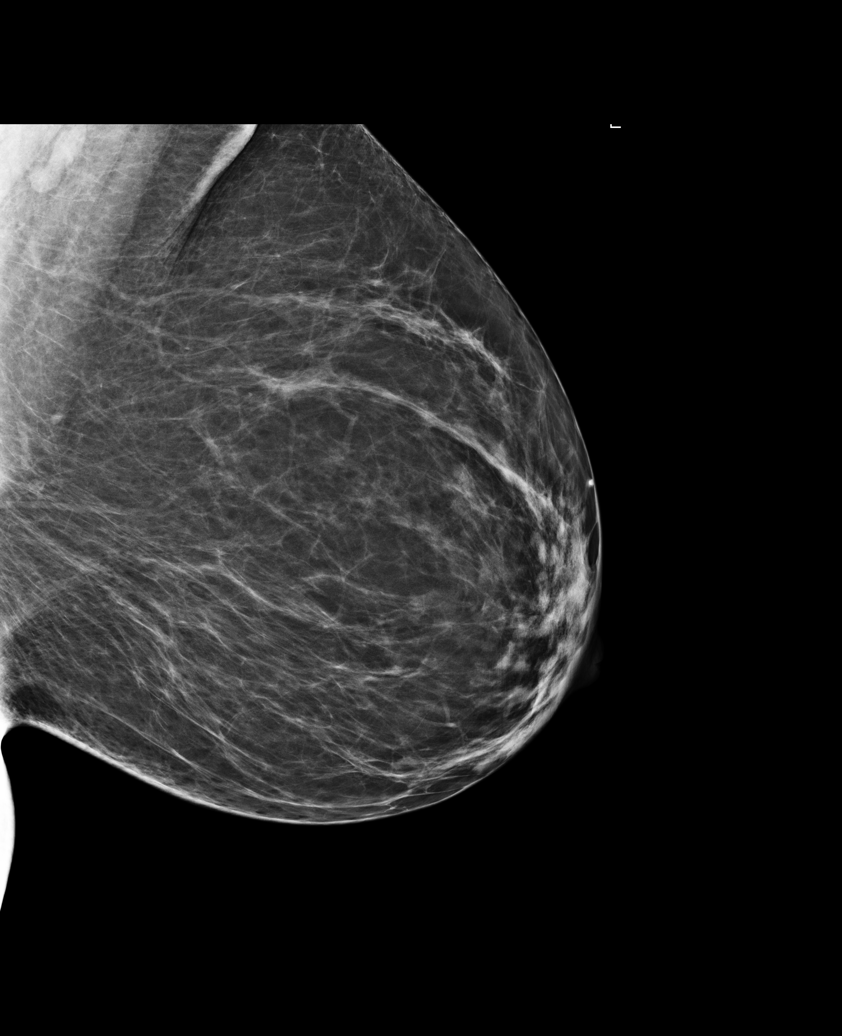

[R MLO]
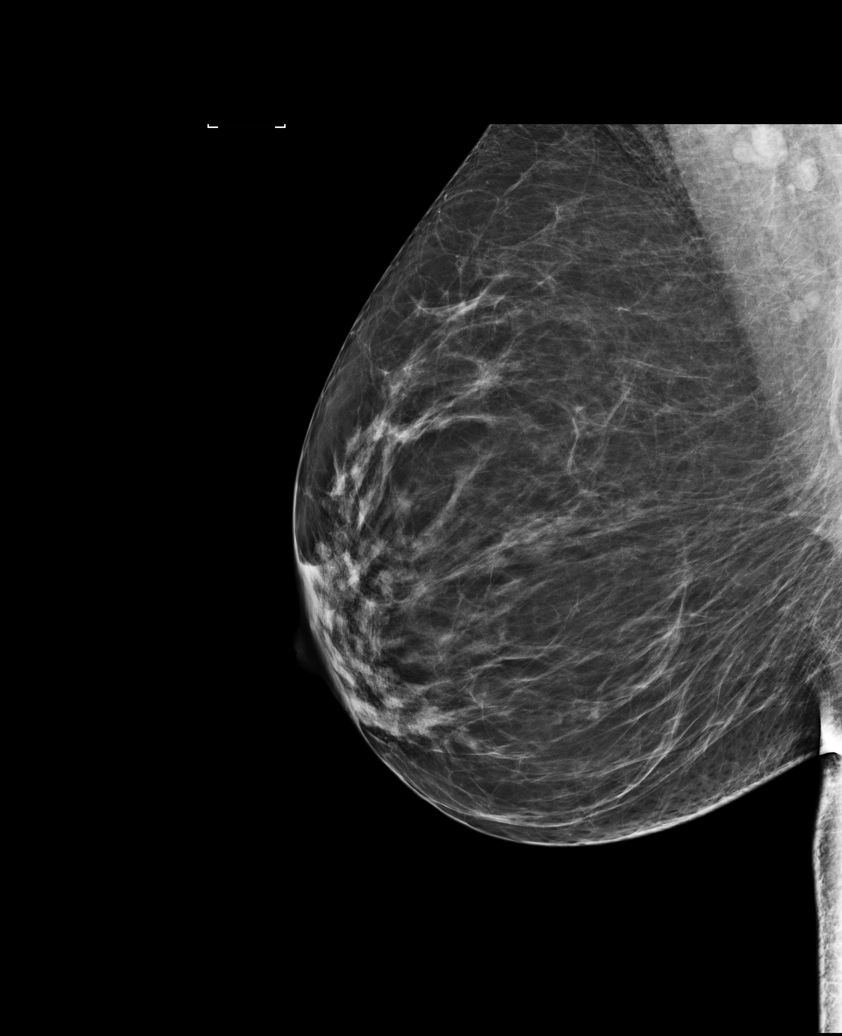

[5 of 5 positions shown; findings below may reference images not displayed]

ACR Breast Density Category b: There are scattered areas of
fibroglandular density.
FINDINGS: There are no findings suspicious for malignancy. Images were
processed with CAD.
IMPRESSION: No mammographic evidence of malignancy. A result letter of this
screening mammogram will be mailed directly to the patient.

RECOMMENDATION:
Screening mammogram in one year. (Code:AS-G-LCT)

BI-RADS CATEGORY  1: Negative.

## 2017-04-08 ENCOUNTER — Other Ambulatory Visit: Payer: Self-pay | Admitting: Internal Medicine

## 2017-04-08 DIAGNOSIS — R51 Headache: Principal | ICD-10-CM

## 2017-04-08 DIAGNOSIS — R519 Headache, unspecified: Secondary | ICD-10-CM

## 2017-04-15 ENCOUNTER — Other Ambulatory Visit: Payer: Self-pay | Admitting: Internal Medicine

## 2017-04-15 DIAGNOSIS — R519 Headache, unspecified: Secondary | ICD-10-CM | POA: Insufficient documentation

## 2017-04-15 DIAGNOSIS — R51 Headache: Principal | ICD-10-CM

## 2017-04-20 ENCOUNTER — Ambulatory Visit
Admission: RE | Admit: 2017-04-20 | Discharge: 2017-04-20 | Disposition: A | Discharge status: Home / Self Care | Payer: BLUE CROSS/BLUE SHIELD | Source: Ambulatory Visit | Attending: Internal Medicine | Admitting: Internal Medicine

## 2017-04-20 ENCOUNTER — Ambulatory Visit: Payer: BLUE CROSS/BLUE SHIELD

## 2017-04-21 ENCOUNTER — Other Ambulatory Visit: Payer: Self-pay | Admitting: Internal Medicine

## 2017-04-21 DIAGNOSIS — Z1231 Encounter for screening mammogram for malignant neoplasm of breast: Secondary | ICD-10-CM

## 2017-05-10 ENCOUNTER — Ambulatory Visit
Admission: RE | Admit: 2017-05-10 | Discharge: 2017-05-10 | Disposition: A | Discharge status: Home / Self Care | Payer: BLUE CROSS/BLUE SHIELD | Source: Ambulatory Visit | Attending: Internal Medicine | Admitting: Internal Medicine

## 2017-05-10 DIAGNOSIS — Z1231 Encounter for screening mammogram for malignant neoplasm of breast: Secondary | ICD-10-CM | POA: Diagnosis present

## 2018-03-09 ENCOUNTER — Other Ambulatory Visit: Payer: Self-pay | Admitting: Internal Medicine

## 2018-03-09 DIAGNOSIS — Z1231 Encounter for screening mammogram for malignant neoplasm of breast: Secondary | ICD-10-CM

## 2018-05-11 ENCOUNTER — Ambulatory Visit
Admission: RE | Admit: 2018-05-11 | Discharge: 2018-05-11 | Disposition: A | Discharge status: Home / Self Care | Payer: BLUE CROSS/BLUE SHIELD | Source: Ambulatory Visit | Attending: Internal Medicine | Admitting: Internal Medicine

## 2018-05-11 DIAGNOSIS — Z1231 Encounter for screening mammogram for malignant neoplasm of breast: Secondary | ICD-10-CM | POA: Diagnosis not present

## 2018-05-24 ENCOUNTER — Encounter: Payer: Self-pay | Admitting: Podiatry

## 2018-05-24 ENCOUNTER — Ambulatory Visit: Payer: BLUE CROSS/BLUE SHIELD | Admitting: Podiatry

## 2018-05-24 ENCOUNTER — Other Ambulatory Visit: Payer: Self-pay | Admitting: Podiatry

## 2018-05-24 ENCOUNTER — Other Ambulatory Visit: Payer: Self-pay

## 2018-05-24 ENCOUNTER — Ambulatory Visit (INDEPENDENT_AMBULATORY_CARE_PROVIDER_SITE_OTHER): Payer: BLUE CROSS/BLUE SHIELD

## 2018-05-24 VITALS — BP 130/76 | HR 62

## 2018-05-24 DIAGNOSIS — M79676 Pain in unspecified toe(s): Secondary | ICD-10-CM | POA: Diagnosis not present

## 2018-05-24 DIAGNOSIS — B351 Tinea unguium: Secondary | ICD-10-CM | POA: Diagnosis not present

## 2018-05-24 DIAGNOSIS — M7989 Other specified soft tissue disorders: Secondary | ICD-10-CM

## 2018-05-24 DIAGNOSIS — M79672 Pain in left foot: Secondary | ICD-10-CM

## 2018-05-24 DIAGNOSIS — M799 Soft tissue disorder, unspecified: Secondary | ICD-10-CM | POA: Diagnosis not present

## 2018-05-24 DIAGNOSIS — L989 Disorder of the skin and subcutaneous tissue, unspecified: Secondary | ICD-10-CM

## 2018-05-30 NOTE — Progress Notes (Signed)
    Subjective: Patient is a 52 y.o. female presenting to the office today with a chief complaint of a painful callus lesions to the bilateral feet that have been present for several months. Bearing weight increases the pain. She has not done anything at home for treatment.   Patient also complains of elongated, thickened nails that cause pain while ambulating in shoes. She is unable to trim her own nails.  She also reports sharp pain to the dorsum of the left foot that began four years ago after a bunionectomy. She reports associated intermittent swelling. Bending the great toe increases the pain. She has not done anything for treatment. Patient presents today for further treatment and evaluation.  History reviewed. No pertinent past medical history.  Objective:  Physical Exam General: Alert and oriented x3 in no acute distress  Dermatology: Hyperkeratotic lesions present on the bilateral feet x 4. Pain on palpation with a central nucleated core noted. Skin is warm, dry and supple bilateral lower extremities. Negative for open lesions or macerations. Nails are tender, long, thickened and dystrophic with subungual debris, consistent with onychomycosis, 1-5 bilateral. No signs of infection noted.  Vascular: Palpable pedal pulses bilaterally. No edema or erythema noted. Capillary refill within normal limits.  Neurological: Epicritic and protective threshold grossly intact bilaterally.   Musculoskeletal Exam: Pain on palpation at the keratotic lesion noted. Palpable, fluctuant, nonadherent mass noted to the 1st MPJ of the left foot. Range of motion within normal limits bilateral. Muscle strength 5/5 in all groups bilateral.  Assessment: 1. Onychodystrophic nails 1-5 bilateral with hyperkeratosis of nails.  2. Onychomycosis of nail due to dermatophyte bilateral 3. Pre-ulcerative callus lesions to the bilateral feet x 4 4. Palpable, fluctuant, nonadherent mass left 1st MPJ   Plan of Care:  1.  Patient evaluated. 2. Excisional debridement of keratoic lesion using a chisel blade was performed without incident.  3. Dressed with light dressing. 4. Mechanical debridement of nails 1-5 bilaterally performed using a nail nipper. Filed with dremel without incident.  5. Discussed possible surgery in [place] office to remove mass.  6. Patient is to return to the clinic in 3 months.   Edrick Kins, DPM [facility]  Dr. Edrick Kins, [address]                                        [place], [place] [address]                Office [phone]  Fax [phone]

## 2018-07-11 ENCOUNTER — Encounter: Payer: Self-pay | Admitting: Podiatry

## 2018-07-11 ENCOUNTER — Other Ambulatory Visit: Payer: Self-pay | Admitting: Podiatry

## 2018-07-11 ENCOUNTER — Ambulatory Visit (INDEPENDENT_AMBULATORY_CARE_PROVIDER_SITE_OTHER): Payer: BLUE CROSS/BLUE SHIELD

## 2018-07-11 ENCOUNTER — Ambulatory Visit (INDEPENDENT_AMBULATORY_CARE_PROVIDER_SITE_OTHER): Payer: BLUE CROSS/BLUE SHIELD | Admitting: Podiatry

## 2018-07-11 DIAGNOSIS — M79672 Pain in left foot: Secondary | ICD-10-CM

## 2018-07-11 DIAGNOSIS — M779 Enthesopathy, unspecified: Secondary | ICD-10-CM | POA: Diagnosis not present

## 2018-07-11 DIAGNOSIS — M21619 Bunion of unspecified foot: Secondary | ICD-10-CM | POA: Diagnosis not present

## 2018-07-11 DIAGNOSIS — Z472 Encounter for removal of internal fixation device: Secondary | ICD-10-CM

## 2018-07-11 DIAGNOSIS — M7752 Other enthesopathy of left foot: Secondary | ICD-10-CM | POA: Diagnosis not present

## 2018-07-11 NOTE — Patient Instructions (Signed)
Pre-Operative Instructions  Congratulations, you have decided to take an important step towards improving your quality of life.  You can be assured that the doctors and staff at [facility] will be with you every step of the way.  Here are some important things you should know:  1. Plan to be at the surgery center/hospital at least 1 (one) hour prior to your scheduled time, unless otherwise directed by the surgical center/hospital staff.  You must have a responsible adult accompany you, remain during the surgery and drive you home.  Make sure you have directions to the surgical center/hospital to ensure you arrive on time. 2. If you are having surgery at [facility] or [facility], you will need a copy of your medical history and physical form from your family physician within one month prior to the date of surgery. We will give you a form for your primary physician to complete.  3. We make every effort to accommodate the date you request for surgery.  However, there are times where surgery dates or times have to be moved.  We will contact you as soon as possible if a change in schedule is required.   4. No aspirin/ibuprofen for one week before surgery.  If you are on aspirin, any non-steroidal anti-inflammatory medications (Mobic, Aleve, Ibuprofen) should not be taken seven (7) days prior to your surgery.  You make take Tylenol for pain prior to surgery.  5. Medications - If you are taking daily heart and blood pressure medications, seizure, reflux, allergy, asthma, anxiety, pain or diabetes medications, make sure you notify the surgery center/hospital before the day of surgery so they can tell you which medications you should take or avoid the day of surgery. 6. No food or drink after midnight the night before surgery unless directed otherwise by surgical center/hospital staff. 7. No alcoholic beverages 24-hours prior to surgery.  No smoking 24-hours prior or 24-hours after  surgery. 8. Wear loose pants or shorts. They should be loose enough to fit over bandages, boots, and casts. 9. Don't wear slip-on shoes. Sneakers are preferred. 10. Bring your boot with you to the surgery center/hospital.  Also bring crutches or a walker if your physician has prescribed it for you.  If you do not have this equipment, it will be provided for you after surgery. 11. If you have not been contacted by the surgery center/hospital by the day before your surgery, call to confirm the date and time of your surgery. 12. Leave-time from work may vary depending on the type of surgery you have.  Appropriate arrangements should be made prior to surgery with your employer. 13. Prescriptions will be provided immediately following surgery by your doctor.  Fill these as soon as possible after surgery and take the medication as directed. Pain medications will not be refilled on weekends and must be approved by the doctor. 14. Remove nail polish on the operative foot and avoid getting pedicures prior to surgery. 15. Wash the night before surgery.  The night before surgery wash the foot and leg well with water and the antibacterial soap provided. Be sure to pay special attention to beneath the toenails and in between the toes.  Wash for at least three (3) minutes. Rinse thoroughly with water and dry well with a towel.  Perform this wash unless told not to do so by your physician.  Enclosed: 1 Ice pack (please put in freezer the night before surgery)   1 Hibiclens skin cleaner     Pre-op instructions  If you have any questions regarding the instructions, please do not hesitate to call our office.  [place]: [address], [place], [place] [address] -- [phone]  [place]: [address] [place], [place] [address] -- [phone]  [place]: [address]  [place], [place] [address] -- [phone]  [place]: [address], [address], [place], [place] [address] -- [phone]  Website:  [email or web address] 

## 2018-07-11 NOTE — Progress Notes (Signed)
Subjective:   Patient ID: Brandi Cruz, female   DOB: 52 y.o.   MRN: [id number]   HPI Patient presents stating she has a bone spur between the big toe second toe of the left and it feels like it is moving around and also the pin bothers her on the left first metatarsal she like it removed at the same time.  States the pain seems to be getting gradually more aggravating for her and she is not sure as to the problem   ROS      Objective:  Physical Exam  Neurovascular status intact negative Homans sign noted with patient found to have a free-floating spur in the first interspace left upper also mild discomfort deep where there is what appears to be bone spur on x-ray.  Prominent proximal pin left is also painful when palpated     Assessment:  Spur formation with possibility for a free-floating spur with abnormal pin position left     Plan:  H&P all conditions reviewed and I did discuss removal of the small spur with possible moderate bunion correction of the area along with removal of pin left.  I allowed her to read consent form going over alternative treatments complications and patient wants surgery understanding complications signed consent form at the current time.  Patient scheduled for outpatient surgery and will have this done at the surgical center and is encouraged to call with any questions understand recovery can take approximately 6 months and at this time is dispensed air fracture walker to keep the big toe immobilized for the first few weeks after surgery

## 2018-08-23 ENCOUNTER — Encounter: Payer: BLUE CROSS/BLUE SHIELD | Admitting: Podiatry

## 2018-08-23 DIAGNOSIS — M2022 Hallux rigidus, left foot: Secondary | ICD-10-CM

## 2018-08-23 DIAGNOSIS — Z4889 Encounter for other specified surgical aftercare: Secondary | ICD-10-CM | POA: Diagnosis not present

## 2018-08-23 DIAGNOSIS — M2012 Hallux valgus (acquired), left foot: Secondary | ICD-10-CM

## 2018-08-29 ENCOUNTER — Ambulatory Visit (INDEPENDENT_AMBULATORY_CARE_PROVIDER_SITE_OTHER): Payer: BLUE CROSS/BLUE SHIELD

## 2018-08-29 ENCOUNTER — Ambulatory Visit (INDEPENDENT_AMBULATORY_CARE_PROVIDER_SITE_OTHER): Payer: BLUE CROSS/BLUE SHIELD | Admitting: Podiatry

## 2018-08-29 ENCOUNTER — Encounter: Payer: Self-pay | Admitting: Podiatry

## 2018-08-29 DIAGNOSIS — M21619 Bunion of unspecified foot: Secondary | ICD-10-CM

## 2018-08-29 DIAGNOSIS — M2012 Hallux valgus (acquired), left foot: Secondary | ICD-10-CM

## 2018-08-29 NOTE — Progress Notes (Signed)
This encounter was created in error - please disregard.

## 2018-08-31 ENCOUNTER — Encounter: Payer: Self-pay | Admitting: Podiatry

## 2018-08-31 DIAGNOSIS — M79676 Pain in unspecified toe(s): Secondary | ICD-10-CM

## 2018-08-31 NOTE — Progress Notes (Signed)
Subjective:   Patient ID: Brandi Cruz, female   DOB: 52 y.o.   MRN: [id number]   HPI Patient states doing very well with minimal discomfort currently and able to walk without significant swelling   ROS      Objective:  Physical Exam  Neurovascular status intact with patient's left foot healing well with wound edges well coapted hallux in rectus position good range of motion noted     Assessment:  Doing well post surgical correction of the left foot with removal of fixation     Plan:  H&P x-ray reviewed and advised on continued elevation compression and reapplied sterile dressing.  Gave instructions on continued immobilization range of motion exercises and patient will be seen back again in the next 4 weeks or earlier if needed  X-ray indicates satisfactory removal of bone from around the first metatarsal with removal of all spurring currently

## 2018-08-31 NOTE — Progress Notes (Signed)
DOS 08/23/2018 Keller/McBride Bunionectomy LT; Removal free moving bone from big toe joint LT; removal proximal 1st MPJ LT

## 2018-09-07 DIAGNOSIS — M79676 Pain in unspecified toe(s): Secondary | ICD-10-CM

## 2018-09-14 ENCOUNTER — Encounter: Payer: Self-pay | Admitting: Podiatry

## 2018-09-14 ENCOUNTER — Ambulatory Visit (INDEPENDENT_AMBULATORY_CARE_PROVIDER_SITE_OTHER): Payer: BLUE CROSS/BLUE SHIELD | Admitting: Podiatry

## 2018-09-14 ENCOUNTER — Ambulatory Visit (INDEPENDENT_AMBULATORY_CARE_PROVIDER_SITE_OTHER): Payer: BLUE CROSS/BLUE SHIELD

## 2018-09-14 DIAGNOSIS — M21612 Bunion of left foot: Secondary | ICD-10-CM

## 2018-09-14 DIAGNOSIS — M21619 Bunion of unspecified foot: Secondary | ICD-10-CM

## 2018-09-15 NOTE — Progress Notes (Signed)
Subjective:   Patient ID: Brandi Cruz, female   DOB: 52 y.o.   MRN: [id number]   HPI Patient states overall doing real well and at times the foot throbs but it feels much better   ROS      Objective:  Physical Exam  Neurovascular status intact with patient's left foot doing very well post removal of fixation with modified McBride bunionectomy.  Negative Homans sign noted     Assessment:  Patient doing well post simple bunion procedure left with removal of fixation     Plan:  Reviewed condition and recommended gradual return to soft shoe gear with increased activity and hopeful return to work in the next 2 to 4 weeks.  Advised on continued elevation compression and patient will be seen back as symptoms indicate and is encouraged to call with any questions concerns  X-ray indicates that the bone structure looks good joint congruous no signs of pathology

## 2018-09-16 ENCOUNTER — Telehealth: Payer: Self-pay | Admitting: Podiatry

## 2018-09-16 NOTE — Telephone Encounter (Signed)
I'm calling Brandi Cruz back to let her know I got the message that my papers pertaining to my benefits/insurance are ready for me to pick up. I wanted to let you know that I will be there on Monday to pick them up. Thank you.

## 2018-10-24 ENCOUNTER — Telehealth: Payer: Self-pay | Admitting: Podiatry

## 2018-10-24 NOTE — Telephone Encounter (Signed)
Patient needs a note faxed over to her Job at [phone] Attn: Melissa, to release her to go back to work Full-time, with no restrictions.

## 2018-10-26 ENCOUNTER — Encounter: Payer: Self-pay | Admitting: Podiatry

## 2018-10-26 NOTE — Telephone Encounter (Signed)
Should be fine.

## 2018-10-26 NOTE — Telephone Encounter (Signed)
I informed pt Dr. Paulla Dolly had okayed her return to work. She states Brandi Cruz, had already sent to work and she needs it sent to [facility] also.

## 2019-07-26 ENCOUNTER — Other Ambulatory Visit: Payer: Self-pay | Admitting: Internal Medicine

## 2019-07-26 DIAGNOSIS — Z1231 Encounter for screening mammogram for malignant neoplasm of breast: Secondary | ICD-10-CM

## 2019-09-13 ENCOUNTER — Ambulatory Visit
Admission: RE | Admit: 2019-09-13 | Discharge: 2019-09-13 | Disposition: A | Discharge status: Home / Self Care | Payer: BC Managed Care – PPO | Source: Ambulatory Visit | Attending: Internal Medicine | Admitting: Internal Medicine

## 2019-09-13 DIAGNOSIS — Z1231 Encounter for screening mammogram for malignant neoplasm of breast: Secondary | ICD-10-CM | POA: Insufficient documentation

## 2020-08-13 ENCOUNTER — Other Ambulatory Visit: Payer: Self-pay | Admitting: Internal Medicine

## 2020-08-13 DIAGNOSIS — Z1231 Encounter for screening mammogram for malignant neoplasm of breast: Secondary | ICD-10-CM

## 2020-09-16 ENCOUNTER — Ambulatory Visit
Admission: RE | Admit: 2020-09-16 | Discharge: 2020-09-16 | Disposition: A | Discharge status: Home / Self Care | Payer: BC Managed Care – PPO | Source: Ambulatory Visit | Attending: Internal Medicine | Admitting: Internal Medicine

## 2020-09-16 ENCOUNTER — Other Ambulatory Visit: Payer: Self-pay

## 2020-09-16 DIAGNOSIS — Z1231 Encounter for screening mammogram for malignant neoplasm of breast: Secondary | ICD-10-CM | POA: Diagnosis not present

## 2021-08-26 ENCOUNTER — Other Ambulatory Visit: Payer: Self-pay | Admitting: Internal Medicine

## 2021-08-26 DIAGNOSIS — Z1231 Encounter for screening mammogram for malignant neoplasm of breast: Secondary | ICD-10-CM

## 2021-09-15 ENCOUNTER — Other Ambulatory Visit: Payer: Self-pay

## 2021-09-15 ENCOUNTER — Ambulatory Visit
Admission: RE | Admit: 2021-09-15 | Discharge: 2021-09-15 | Disposition: A | Discharge status: Home / Self Care | Payer: BC Managed Care – PPO | Source: Ambulatory Visit | Attending: Internal Medicine | Admitting: Internal Medicine

## 2021-09-15 DIAGNOSIS — Z1231 Encounter for screening mammogram for malignant neoplasm of breast: Secondary | ICD-10-CM | POA: Diagnosis not present

## 2021-12-08 ENCOUNTER — Encounter: Admission: RE | Payer: Self-pay | Source: Home / Self Care

## 2021-12-08 ENCOUNTER — Ambulatory Visit: Admission: RE | Admit: 2021-12-08 | Payer: BC Managed Care – PPO | Source: Home / Self Care

## 2021-12-08 SURGERY — COLONOSCOPY WITH PROPOFOL
Anesthesia: General

## 2022-03-02 ENCOUNTER — Encounter
Admission: RE | Disposition: A | Discharge status: Home / Self Care | Payer: Self-pay | Source: Home / Self Care | Attending: Gastroenterology

## 2022-03-02 ENCOUNTER — Ambulatory Visit: Payer: BC Managed Care – PPO | Admitting: Anesthesiology

## 2022-03-02 ENCOUNTER — Ambulatory Visit
Admission: RE | Admit: 2022-03-02 | Discharge: 2022-03-02 | Disposition: A | Discharge status: Home / Self Care | Payer: BC Managed Care – PPO | Attending: Gastroenterology | Admitting: Gastroenterology

## 2022-03-02 ENCOUNTER — Encounter: Payer: Self-pay | Admitting: *Deleted

## 2022-03-02 DIAGNOSIS — K621 Rectal polyp: Secondary | ICD-10-CM | POA: Insufficient documentation

## 2022-03-02 DIAGNOSIS — Z86010 Personal history of colonic polyps: Secondary | ICD-10-CM | POA: Insufficient documentation

## 2022-03-02 DIAGNOSIS — K5730 Diverticulosis of large intestine without perforation or abscess without bleeding: Secondary | ICD-10-CM | POA: Diagnosis not present

## 2022-03-02 DIAGNOSIS — Z1211 Encounter for screening for malignant neoplasm of colon: Secondary | ICD-10-CM | POA: Insufficient documentation

## 2022-03-02 DIAGNOSIS — F17210 Nicotine dependence, cigarettes, uncomplicated: Secondary | ICD-10-CM | POA: Insufficient documentation

## 2022-03-02 HISTORY — PX: COLONOSCOPY WITH PROPOFOL: SHX5780

## 2022-03-02 SURGERY — COLONOSCOPY WITH PROPOFOL
Anesthesia: General

## 2022-03-02 MED ORDER — FENTANYL CITRATE (PF) 100 MCG/2ML IJ SOLN
INTRAMUSCULAR | Status: AC
  Filled 2022-03-02: qty 2

## 2022-03-02 MED ORDER — MIDAZOLAM HCL (PF) 5 MG/ML IJ SOLN
INTRAMUSCULAR | Status: DC | PRN
  Administered 2022-03-02: 2 mg via INTRAVENOUS

## 2022-03-02 MED ORDER — PROPOFOL 10 MG/ML IV BOLUS
INTRAVENOUS | Status: DC | PRN
  Administered 2022-03-02: 50 mg via INTRAVENOUS

## 2022-03-02 MED ORDER — LIDOCAINE HCL (PF) 2 % IJ SOLN
INTRAMUSCULAR | Status: AC
  Filled 2022-03-02: qty 5

## 2022-03-02 MED ORDER — LIDOCAINE HCL (CARDIAC) PF 100 MG/5ML IV SOSY
PREFILLED_SYRINGE | INTRAVENOUS | Status: DC | PRN
  Administered 2022-03-02: 50 mg via INTRAVENOUS

## 2022-03-02 MED ORDER — SODIUM CHLORIDE 0.9 % IV SOLN: INTRAVENOUS | Status: DC

## 2022-03-02 MED ORDER — PROPOFOL 500 MG/50ML IV EMUL
INTRAVENOUS | Status: DC | PRN
  Administered 2022-03-02: 50 ug/kg/min via INTRAVENOUS

## 2022-03-02 MED ORDER — MIDAZOLAM HCL 2 MG/2ML IJ SOLN
INTRAMUSCULAR | Status: AC
  Filled 2022-03-02: qty 2

## 2022-03-02 MED ORDER — FENTANYL CITRATE (PF) 100 MCG/2ML IJ SOLN
INTRAMUSCULAR | Status: DC | PRN
  Administered 2022-03-02 (×2): 50 ug via INTRAVENOUS

## 2022-03-02 NOTE — Op Note (Signed)
[facility] ?Gastroenterology ?Patient Name: Brandi Cruz ?Procedure Date: 03/02/2022 7:52 AM ?MRN: [id number] ?Account #: [id number] ?Date of Birth: 04-Mar-1966 ?Admit Type: Outpatient ?Age: 56 ?Room: [facility] ENDO ROOM 3 ?Gender: Female ?Note Status: Finalized ?Instrument Name: Colonoscope [id number] ?Procedure:             Colonoscopy ?Indications:           Surveillance: Personal history of adenomatous polyps  ?                       on last colonoscopy > 5 years ago ?Providers:             Andrey Farmer MD, MD ?Referring MD:          Tracie Harrier, MD (Referring MD) ?Medicines:             Monitored Anesthesia Care ?Complications:         No immediate complications. Estimated blood loss:  ?                       Minimal. ?Procedure:             Pre-Anesthesia Assessment: ?                       - Prior to the procedure, a History and Physical was  ?                       performed, and patient medications and allergies were  ?                       reviewed. The patient is competent. The risks and  ?                       benefits of the procedure and the sedation options and  ?                       risks were discussed with the patient. All questions  ?                       were answered and informed consent was obtained.  ?                       Patient identification and proposed procedure were  ?                       verified by the physician, the nurse, the  ?                       anesthesiologist, the anesthetist and the technician  ?                       in the endoscopy suite. Mental Status Examination:  ?                       alert and oriented. Airway Examination: normal  ?                       oropharyngeal airway and neck mobility. Respiratory  ?  Examination: clear to auscultation. CV Examination:  ?                       normal. Prophylactic Antibiotics: The patient does not  ?                       require prophylactic antibiotics. Prior  ?                        Anticoagulants: The patient has taken no previous  ?                       anticoagulant or antiplatelet agents. ASA Grade  ?                       Assessment: II - A patient with mild systemic disease.  ?                       After reviewing the risks and benefits, the patient  ?                       was deemed in satisfactory condition to undergo the  ?                       procedure. The anesthesia plan was to use monitored  ?                       anesthesia care (MAC). Immediately prior to  ?                       administration of medications, the patient was  ?                       re-assessed for adequacy to receive sedatives. The  ?                       heart rate, respiratory rate, oxygen saturations,  ?                       blood pressure, adequacy of pulmonary ventilation, and  ?                       response to care were monitored throughout the  ?                       procedure. The physical status of the patient was  ?                       re-assessed after the procedure. ?                       After obtaining informed consent, the colonoscope was  ?                       passed under direct vision. Throughout the procedure,  ?                       the patient's blood pressure, pulse, and oxygen  ?  saturations were monitored continuously. The  ?                       Colonoscope was introduced through the anus and  ?                       advanced to the the cecum, identified by appendiceal  ?                       orifice and ileocecal valve. The colonoscopy was  ?                       somewhat difficult due to poor endoscopic  ?                       visualization. The patient tolerated the procedure  ?                       well. The quality of the bowel preparation was fair. ?Findings: ?     The perianal and digital rectal examinations were normal. ?     Two sessile polyps were found in the rectum. The polyps were 1 to 2 mm  ?     in size. These polyps were  removed with a jumbo cold forceps. Resection  ?     and retrieval were complete. Estimated blood loss was minimal. ?     Multiple small-mouthed diverticula were found in the sigmoid colon,  ?     descending colon and ascending colon. ?     The exam was otherwise without abnormality on direct and retroflexion  ?     views. ?Impression:            - Preparation of the colon was fair. ?                       - Two 1 to 2 mm polyps in the rectum, removed with a  ?                       jumbo cold forceps. Resected and retrieved. ?                       - Diverticulosis in the sigmoid colon, in the  ?                       descending colon and in the ascending colon. ?                       - The examination was otherwise normal on direct and  ?                       retroflexion views. ?Recommendation:        - Discharge patient to home. ?                       - Resume previous diet. ?                       - Continue present medications. ?                       -  Await pathology results. ?                       - Repeat colonoscopy in 1-2 years because the bowel  ?                       preparation was suboptimal. ?                       - Return to referring physician as previously  ?                       scheduled. ?Procedure Code(s):     --- Professional --- ?                       [phone], Colonoscopy, flexible; with biopsy, single or  ?                       multiple ?Diagnosis Code(s):     --- Professional --- ?                       Z86.010, Personal history of colonic polyps ?                       K62.1, Rectal polyp ?                       K57.30, Diverticulosis of large intestine without  ?                       perforation or abscess without bleeding ?CPT copyright 2019 American Medical Association. All rights reserved. ?The codes documented in this report are preliminary and upon coder review may  ?be revised to meet current compliance requirements. ?Andrey Farmer MD, MD ?03/02/2022 8:17:14 AM ?Number of  Addenda: 0 ?Note Initiated On: 03/02/2022 7:52 AM ?Scope Withdrawal Time: 0 hours 7 minutes 30 seconds  ?Total Procedure Duration: 0 hours 13 minutes 29 seconds  ?Estimated Blood Loss:  Estimated blood loss was minimal. ?     [facility] ?

## 2022-03-02 NOTE — Anesthesia Preprocedure Evaluation (Addendum)
Anesthesia Evaluation  ?Patient identified by MRN, date of birth, ID band ?Patient awake ? ? ? ?Reviewed: ?Allergy & Precautions, NPO status , Patient's Chart, lab work & pertinent test results ? ?History of Anesthesia Complications ?Negative for: history of anesthetic complications ? ?Airway ?Mallampati: II ? ? ?Neck ROM: Full ? ? ? Dental ? ? ?Missing molars:   ?Pulmonary ?Current Smoker (1/2 ppd) and Patient abstained from smoking.,  ?  ?Pulmonary exam normal ?breath sounds clear to auscultation ? ? ? ? ? ? Cardiovascular ?Exercise Tolerance: Good ?negative cardio ROS ?Normal cardiovascular exam ?Rhythm:Regular Rate:Normal ? ? ?  ?Neuro/Psych ?negative neurological ROS ?   ? GI/Hepatic ?negative GI ROS,   ?Endo/Other  ?negative endocrine ROS ? Renal/GU ?negative Renal ROS  ? ?  ?Musculoskeletal ? ? Abdominal ?  ?Peds ? Hematology ?negative hematology ROS ?(+)   ?Anesthesia Other Findings ? ? Reproductive/Obstetrics ? ?  ? ? ? ? ? ? ? ? ? ? ? ? ? ?  ?  ? ? ? ? ? ? ? ?Anesthesia Physical ?Anesthesia Plan ? ?ASA: 2 ? ?Anesthesia Plan: General  ? ?Post-op Pain Management:   ? ?Induction: Intravenous ? ?PONV Risk Score and Plan: 2 and Propofol infusion, TIVA and Treatment may vary due to age or medical condition ? ?Airway Management Planned: Natural Airway ? ?Additional Equipment:  ? ?Intra-op Plan:  ? ?Post-operative Plan:  ? ?Informed Consent: I have reviewed the patients History and Physical, chart, labs and discussed the procedure including the risks, benefits and alternatives for the proposed anesthesia with the patient or authorized representative who has indicated his/her understanding and acceptance.  ? ? ? ? ? ?Plan Discussed with: CRNA ? ?Anesthesia Plan Comments: (LMA/GETA backup discussed.  Patient consented for risks of anesthesia including but not limited to:  ?- adverse reactions to medications ?- damage to eyes, teeth, lips or other oral mucosa ?- nerve damage due to  positioning  ?- sore throat or hoarseness ?- damage to heart, brain, nerves, lungs, other parts of body or loss of life ? ?Informed patient about role of CRNA in peri- and intra-operative care.  Patient voiced understanding.)  ? ? ? ? ? ? ?Anesthesia Quick Evaluation ? ?

## 2022-03-02 NOTE — Anesthesia Postprocedure Evaluation (Signed)
Anesthesia Post Note ? ?Patient: Brandi Cruz ? ?Procedure(s) Performed: COLONOSCOPY WITH PROPOFOL ? ?Patient location during evaluation: PACU ?Anesthesia Type: General ?Level of consciousness: awake and alert, oriented and patient cooperative ?Pain management: pain level controlled ?Vital Signs Assessment: post-procedure vital signs reviewed and stable ?Respiratory status: spontaneous breathing, nonlabored ventilation and respiratory function stable ?Cardiovascular status: blood pressure returned to baseline and stable ?Postop Assessment: adequate PO intake ?Anesthetic complications: no ? ? ?No notable events documented. ? ? ?Last Vitals:  ?Vitals:  ? 03/02/22 0825 03/02/22 0835  ?BP: 118/69 130/69  ?Pulse: (!) 58 (!) 54  ?Resp: 17 14  ?Temp:    ?SpO2: 100% 100%  ?  ?Last Pain:  ?Vitals:  ? 03/02/22 0835  ?TempSrc:   ?PainSc: 0-No pain  ? ? ?  ?  ?  ?  ?  ?  ? ?Darrin Nipper ? ? ? ? ?

## 2022-03-02 NOTE — H&P (Signed)
Outpatient short stay form Pre-procedure ?03/02/2022  ?Lesly Rubenstein, MD ? ?Primary Physician: Tracie Harrier, MD ? ?Reason for visit:  Surveillance colonoscopy ? ?History of present illness:   ? ?56 y/o lady with history of adenomatous polyps on last colonoscopy in 2016 here for surveillance colonoscopy. No blood thinners. No family history of GI malignancies. No abdominal surgeries. ? ? ? ?Current Facility-Administered Medications:  ?  0.9 %  sodium chloride infusion, , Intravenous, Continuous, Micah Barnier, Hilton Cork, MD, Last Rate: 20 mL/hr at 03/02/22 0728, New Bag at 03/02/22 0728 ? ?Medications Prior to Admission  ?Medication Sig Dispense Refill Last Dose  ? acetaminophen (TYLENOL) 650 MG CR tablet Take by mouth.     ? Cholecalciferol (VITAMIN D3) 1000 units CAPS Take by mouth.     ? fluticasone (FLONASE) 50 MCG/ACT nasal spray SPRAY 2 SPRAYS INTO EACH NOSTRIL EVERY DAY  1   ? HYDROcodone-acetaminophen (NORCO) 10-325 MG tablet Take 1 tablet by mouth every 6 (six) hours as needed. Dispensed 25 with no refills on 08/23/18 (Patient not taking: Reported on 02/27/2022)  0 Not Taking  ? ibuprofen (ADVIL,MOTRIN) 200 MG tablet Take 200 mg by mouth daily.     ? meloxicam (MOBIC) 15 MG tablet Take 1 tablet (15 mg total) by mouth daily. 30 tablet 2   ? oxyCODONE-acetaminophen (PERCOCET/ROXICET) 5-325 MG per tablet Take 1 tablet by mouth every 6 (six) hours as needed for severe pain. (Patient not taking: Reported on 02/27/2022) 30 tablet 0 Not Taking  ? promethazine (PHENERGAN) 12.5 MG tablet Take 12.5 mg by mouth every 6 (six) hours as needed for nausea or vomiting. (Patient not taking: Reported on 02/27/2022)   Not Taking  ? Promethazine-DM 6.25-15 MG/5ML SOLN TAKE 5 MILLILITERS BY ORAL ROUTE EVERY 6 HOURS AS NEEDED (Patient not taking: Reported on 02/27/2022)  0 Not Taking  ? tiZANidine (ZANAFLEX) 2 MG tablet Take 1-2 tablets daily at bedtime as needed. (Patient not taking: Reported on 02/27/2022)   Not Taking  ? ? ? ?No  Known Allergies ? ? ?History reviewed. No pertinent past medical history. ? ?Review of systems:  Otherwise negative.  ? ? ?Physical Exam ? ?Gen: Alert, oriented. Appears stated age.  ?HEENT:PERRLA. ?Lungs: No respiratory distress ?CV: RRR ?Abd: soft, benign, no masses ?Ext: No edema ? ? ? ?Planned procedures: Proceed with colonoscopy. The patient understands the nature of the planned procedure, indications, risks, alternatives and potential complications including but not limited to bleeding, infection, perforation, damage to internal organs and possible oversedation/side effects from anesthesia. The patient agrees and gives consent to proceed.  ?Please refer to procedure notes for findings, recommendations and patient disposition/instructions.  ? ? ? ?Lesly Rubenstein, MD ?[facility] ? ? ? ?  ? ?

## 2022-03-02 NOTE — Interval H&P Note (Signed)
History and Physical Interval Note: ? ?03/02/2022 ?7:51 AM ? ?Brandi Cruz  has presented today for surgery, with the diagnosis of PRS HX POLYPS.  The various methods of treatment have been discussed with the patient and family. After consideration of risks, benefits and other options for treatment, the patient has consented to  Procedure(s): ?COLONOSCOPY WITH PROPOFOL (N/A) as a surgical intervention.  The patient's history has been reviewed, patient examined, no change in status, stable for surgery.  I have reviewed the patient's chart and labs.  Questions were answered to the patient's satisfaction.   ? ? ?Hilton Cork Hezekiah Veltre ? ?Ok to proceed with colonoscopy ?

## 2022-03-02 NOTE — Transfer of Care (Signed)
Immediate Anesthesia Transfer of Care Note ? ?Patient: Brandi Cruz ? ?Procedure(s) Performed: COLONOSCOPY WITH PROPOFOL ? ?Patient Location: PACU ? ?Anesthesia Type:General ? ?Level of Consciousness: sedated ? ?Airway & Oxygen Therapy: Patient Spontanous Breathing and Patient connected to nasal cannula oxygen ? ?Post-op Assessment: Report given to RN and Post -op Vital signs reviewed and stable ? ?Post vital signs: Reviewed and stable ? ?Last Vitals:  ?Vitals Value Taken Time  ?BP 101/64 03/02/22 0816  ?Temp    ?Pulse 58 03/02/22 0816  ?Resp 15 03/02/22 0816  ?SpO2 100 % 03/02/22 0816  ?Vitals shown include unvalidated device data. ? ?Last Pain:  ?Vitals:  ? 03/02/22 0712  ?TempSrc: Temporal  ?   ? ?  ? ?Complications: No notable events documented. ?

## 2022-03-03 ENCOUNTER — Encounter: Payer: Self-pay | Admitting: Gastroenterology

## 2022-03-03 LAB — SURGICAL PATHOLOGY

## 2022-08-19 ENCOUNTER — Other Ambulatory Visit: Payer: Self-pay | Admitting: Internal Medicine

## 2022-09-17 ENCOUNTER — Other Ambulatory Visit: Payer: Self-pay | Admitting: Internal Medicine

## 2022-11-02 ENCOUNTER — Other Ambulatory Visit: Payer: Self-pay | Admitting: Internal Medicine

## 2022-11-02 DIAGNOSIS — Z1231 Encounter for screening mammogram for malignant neoplasm of breast: Secondary | ICD-10-CM

## 2022-12-17 ENCOUNTER — Ambulatory Visit
Admission: RE | Admit: 2022-12-17 | Discharge: 2022-12-17 | Disposition: A | Discharge status: Home / Self Care | Payer: BC Managed Care – PPO | Source: Ambulatory Visit | Attending: Internal Medicine | Admitting: Internal Medicine

## 2022-12-17 DIAGNOSIS — Z1231 Encounter for screening mammogram for malignant neoplasm of breast: Secondary | ICD-10-CM

## 2023-10-14 ENCOUNTER — Ambulatory Visit: Admission: RE | Admit: 2023-10-14 | Payer: BC Managed Care – PPO | Source: Ambulatory Visit | Admitting: *Deleted

## 2024-02-14 ENCOUNTER — Other Ambulatory Visit: Payer: Self-pay | Admitting: Internal Medicine

## 2024-02-14 DIAGNOSIS — Z1231 Encounter for screening mammogram for malignant neoplasm of breast: Secondary | ICD-10-CM

## 2024-03-22 ENCOUNTER — Ambulatory Visit
Admission: RE | Admit: 2024-03-22 | Discharge: 2024-03-22 | Disposition: A | Discharge status: Home / Self Care | Source: Ambulatory Visit | Attending: Internal Medicine | Admitting: Internal Medicine

## 2024-03-22 DIAGNOSIS — Z1231 Encounter for screening mammogram for malignant neoplasm of breast: Secondary | ICD-10-CM | POA: Insufficient documentation
